# Patient Record
Sex: Female | Born: 1995
Health system: Southern US, Community
[De-identification: ages and names within clinical notes are randomized; demographics above are authoritative.]

## PROBLEM LIST (undated history)

## (undated) DIAGNOSIS — D649 Anemia, unspecified: Secondary | ICD-10-CM

## (undated) DIAGNOSIS — A630 Anogenital (venereal) warts: Secondary | ICD-10-CM

## (undated) DIAGNOSIS — Z973 Presence of spectacles and contact lenses: Secondary | ICD-10-CM

## (undated) DIAGNOSIS — N944 Primary dysmenorrhea: Secondary | ICD-10-CM

## (undated) HISTORY — DX: Anemia, unspecified: D64.9

## (undated) HISTORY — PX: WISDOM TOOTH EXTRACTION: SHX21

## (undated) HISTORY — DX: Primary dysmenorrhea: N94.4

## (undated) HISTORY — DX: Anogenital (venereal) warts: A63.0

## (undated) HISTORY — PX: OTHER SURGICAL HISTORY: SHX169

---
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
## 2015-06-13 ENCOUNTER — Other Ambulatory Visit: Payer: Self-pay | Admitting: Family Medicine

## 2015-06-14 NOTE — Telephone Encounter (Signed)
Patient requesting refill. 

## 2015-06-14 NOTE — Telephone Encounter (Signed)
Sending refill until Nov when she is due for a CPE, please schedule follow up. Thank you

## 2015-08-03 ENCOUNTER — Telehealth: Payer: Self-pay | Admitting: Family Medicine

## 2015-08-03 NOTE — Telephone Encounter (Signed)
Pt called stating she is spotting in between period on her birth control and it has been happening for a while?  Can u change to something else?

## 2015-08-03 NOTE — Telephone Encounter (Signed)
Patient is requesting a return call. Would like to discuss her birthcontrol [phone]

## 2015-08-04 NOTE — Telephone Encounter (Signed)
Spotting is very common when we prescribe 3 months straight of ocp's.  If she bleeds heavily she can stop taking medication for 5 days and resume it again. We can switch her to ocp's like Sprintec and have a cycle q 28 days

## 2015-08-04 NOTE — Telephone Encounter (Signed)
Pt notified common and will continue at this time

## 2015-11-10 ENCOUNTER — Ambulatory Visit (INDEPENDENT_AMBULATORY_CARE_PROVIDER_SITE_OTHER): Payer: BLUE CROSS/BLUE SHIELD | Admitting: Family Medicine

## 2015-11-10 ENCOUNTER — Encounter: Payer: Self-pay | Admitting: Family Medicine

## 2015-11-10 VITALS — BP 114/76 | HR 82 | Temp 98.0°F | Resp 16 | Ht 66.0 in | Wt 134.9 lb

## 2015-11-10 DIAGNOSIS — Z23 Encounter for immunization: Secondary | ICD-10-CM | POA: Diagnosis not present

## 2015-11-10 DIAGNOSIS — Z3041 Encounter for surveillance of contraceptive pills: Secondary | ICD-10-CM | POA: Diagnosis not present

## 2015-11-10 DIAGNOSIS — N923 Ovulation bleeding: Secondary | ICD-10-CM

## 2015-11-10 DIAGNOSIS — Z0000 Encounter for general adult medical examination without abnormal findings: Secondary | ICD-10-CM | POA: Diagnosis not present

## 2015-11-10 DIAGNOSIS — Z113 Encounter for screening for infections with a predominantly sexual mode of transmission: Secondary | ICD-10-CM | POA: Diagnosis not present

## 2015-11-10 DIAGNOSIS — Z01419 Encounter for gynecological examination (general) (routine) without abnormal findings: Secondary | ICD-10-CM

## 2015-11-10 DIAGNOSIS — Z13220 Encounter for screening for lipoid disorders: Secondary | ICD-10-CM

## 2015-11-10 DIAGNOSIS — Z79899 Other long term (current) drug therapy: Secondary | ICD-10-CM | POA: Diagnosis not present

## 2015-11-10 DIAGNOSIS — M419 Scoliosis, unspecified: Secondary | ICD-10-CM | POA: Insufficient documentation

## 2015-11-10 DIAGNOSIS — N939 Abnormal uterine and vaginal bleeding, unspecified: Secondary | ICD-10-CM | POA: Diagnosis not present

## 2015-11-10 LAB — POCT URINE PREGNANCY: PREG TEST UR: NEGATIVE

## 2015-11-10 MED ORDER — LEVONORGEST-ETH ESTRAD 91-DAY 0.15-0.03 MG PO TABS: 1.0000 | ORAL_TABLET | Freq: Every day | ORAL | Status: DC

## 2015-11-10 NOTE — Progress Notes (Signed)
Name: Alison Nunez   MRN: [id number]    DOB: 1996-12-15   Date:11/10/2015       Progress Note  Subjective  Chief Complaint  Chief Complaint  Patient presents with  . Annual Exam    HPI  Well woman: patient is doing well, now sexually active, dating for the past 8 months, in school full time, no complains except for intermenstrual spotting, and this month has been bleeding, uses condoms also for STI screen, has an older boyfriend that is getting ready to join the [employer]   Past Surgical History  Procedure Laterality Date  . History of removal of cyst      pionidal cyst x's 2.    History reviewed. No pertinent family history.  Social History   Social History  . Marital Status: Single    Spouse Name: N/A  . Number of Children: N/A  . Years of Education: N/A   Occupational History  . Not on file.   Social History Main Topics  . Smoking status: Never Smoker   . Smokeless tobacco: Never Used  . Alcohol Use: No  . Drug Use: No  . Sexual Activity:    Partners: Male    Birth Control/ Protection: Pill   Other Topics Concern  . Not on file   Social History Narrative     Current outpatient prescriptions:  .  levonorgestrel-ethinyl estradiol (SEASONALE,INTROVALE,JOLESSA) 0.15-0.03 MG tablet, Take 1 tablet by mouth daily., Disp: 1 Package, Rfl: 0  No Known Allergies   ROS  Constitutional: Negative for fever or weight change.  Respiratory: Negative for cough and shortness of breath.   Cardiovascular: Negative for chest pain or palpitations.  Gastrointestinal: Negative for abdominal pain, no bowel changes.  Musculoskeletal: Negative for gait problem or joint swelling.  Skin: Negative for rash.  Neurological: Negative for dizziness or headache.  No other specific complaints in a complete review of systems (except as listed in HPI above).  Objective  Filed Vitals:   11/10/15 1437  BP: 114/76  Pulse: 82  Temp: 98 F (36.7 C)  TempSrc: Oral  Resp: 16  Height:   (1.676 m)  Weight: 134 lb 14.4 oz (61.19 kg)  SpO2: 99%    Body mass index is 21.78 kg/(m^2).  Physical Exam  Constitutional: Patient appears well-developed and well-nourished. No distress.  HENT: Head: Normocephalic and atraumatic. Ears: B TMs ok, no erythema or effusion; Nose: Nose normal. Mouth/Throat: Oropharynx is clear and moist. No oropharyngeal exudate.  Eyes: Conjunctivae and EOM are normal. Pupils are equal, round, and reactive to light. No scleral icterus.  Neck: Normal range of motion. Neck supple. No JVD present. No thyromegaly present.  Cardiovascular: Normal rate, regular rhythm and normal heart sounds.  No murmur heard. No BLE edema. Pulmonary/Chest: Effort normal and breath sounds normal. No respiratory distress. Abdominal: Soft. Bowel sounds are normal, no distension. There is no tenderness. no masses Breast: no lumps or masses, no nipple discharge or rashes FEMALE GENITALIA:  Not done on her cycle RECTAL: not done Musculoskeletal: Normal range of motion, no joint effusion Mild scoliosis Neurological: he is alert and oriented to person, place, and time. No cranial nerve deficit. Coordination, balance, strength, speech and gait are normal.  Skin: Skin is warm and dry. No rash noted. No erythema.  Psychiatric: Patient has a normal mood and affect. behavior is normal. Judgment and thought content normal.    PHQ2/9: Depression screen PHQ 2/9 11/10/2015  Decreased Interest 0  Down, Depressed, Hopeless 0  PHQ - 2 Score 0     Fall Risk: Fall Risk  11/10/2015  Falls in the past year? No     Functional Status Survey: Is the patient deaf or have difficulty hearing?: No Does the patient have difficulty seeing, even when wearing glasses/contacts?: Yes (glasses and contacts) Does the patient have difficulty concentrating, remembering, or making decisions?: No Does the patient have difficulty walking or climbing stairs?: No Does the patient have difficulty  dressing or bathing?: No Does the patient have difficulty doing errands alone such as visiting a doctor's office or shopping?: No    Assessment & Plan  1. Well woman exam  Discussed importance of 150 minutes of physical activity weekly, eat two servings of fish weekly, eat one serving of tree nuts ( cashews, pistachios, pecans, almonds.Marland Kitchen.) every other day, eat 6 servings of fruit/vegetables daily and drink plenty of water and avoid sweet beverages.   2. Needs flu shot  - Flu Vaccine QUAD 36+ mos PF IM (Fluarix & Fluzone Quad PF)  3. Encounter for surveillance of contraceptive pills  - levonorgestrel-ethinyl estradiol (SEASONALE,INTROVALE,JOLESSA) 0.15-0.03 MG tablet; Take 1 tablet by mouth daily.  Dispense: 1 Package; Refill: 0  4. Spotting between menses  On [place] and spots on the first two months, but this time is bleeding like a cycle, we will change to [place], and return in one month for a pap smear and follow up - POCT urine pregnancy - TSH  5. Routine screening for STI (sexually transmitted infection)  - HIV antibody - RPR - GC/chlamydia probe amp, urine  6. Long-term use of high-risk medication  - Comprehensive metabolic panel  7. Lipid screening  - Lipid panel - Comprehensive metabolic panel

## 2015-11-13 ENCOUNTER — Other Ambulatory Visit: Payer: Self-pay | Admitting: Family Medicine

## 2015-11-16 LAB — GC/CHLAMYDIA PROBE AMP
CHLAMYDIA, DNA PROBE: NEGATIVE
NEISSERIA GONORRHOEAE BY PCR: NEGATIVE

## 2015-11-16 LAB — SPECIMEN STATUS REPORT

## 2016-02-01 ENCOUNTER — Other Ambulatory Visit: Payer: Self-pay | Admitting: Family Medicine

## 2016-02-02 NOTE — Telephone Encounter (Signed)
Patient requesting refill. 

## 2016-02-24 ENCOUNTER — Other Ambulatory Visit: Payer: BLUE CROSS/BLUE SHIELD

## 2016-03-25 ENCOUNTER — Encounter: Payer: Self-pay | Admitting: *Deleted

## 2016-03-25 ENCOUNTER — Ambulatory Visit
Admit: 2016-03-25 | Discharge: 2016-03-25 | Disposition: A | Discharge status: Home / Self Care | Payer: BLUE CROSS/BLUE SHIELD | Attending: Family Medicine | Admitting: Family Medicine

## 2016-03-25 ENCOUNTER — Ambulatory Visit
Admission: EM | Admit: 2016-03-25 | Discharge: 2016-03-25 | Disposition: A | Discharge status: Home / Self Care | Payer: BLUE CROSS/BLUE SHIELD | Attending: Family Medicine | Admitting: Family Medicine

## 2016-03-25 DIAGNOSIS — T148XXA Other injury of unspecified body region, initial encounter: Secondary | ICD-10-CM

## 2016-03-25 DIAGNOSIS — Z79899 Other long term (current) drug therapy: Secondary | ICD-10-CM | POA: Insufficient documentation

## 2016-03-25 DIAGNOSIS — B079 Viral wart, unspecified: Secondary | ICD-10-CM | POA: Diagnosis not present

## 2016-03-25 DIAGNOSIS — S0083XA Contusion of other part of head, initial encounter: Secondary | ICD-10-CM | POA: Diagnosis not present

## 2016-03-25 DIAGNOSIS — T148 Other injury of unspecified body region: Secondary | ICD-10-CM

## 2016-03-25 DIAGNOSIS — R51 Headache: Secondary | ICD-10-CM | POA: Diagnosis not present

## 2016-03-25 DIAGNOSIS — S0993XA Unspecified injury of face, initial encounter: Secondary | ICD-10-CM | POA: Diagnosis present

## 2016-03-25 DIAGNOSIS — N944 Primary dysmenorrhea: Secondary | ICD-10-CM | POA: Diagnosis not present

## 2016-03-25 DIAGNOSIS — Z9889 Other specified postprocedural states: Secondary | ICD-10-CM | POA: Diagnosis not present

## 2016-03-25 DIAGNOSIS — S022XXA Fracture of nasal bones, initial encounter for closed fracture: Secondary | ICD-10-CM | POA: Diagnosis not present

## 2016-03-25 LAB — PREGNANCY, URINE: Preg Test, Ur: NEGATIVE
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
## 2016-03-26 ENCOUNTER — Ambulatory Visit: Payer: BLUE CROSS/BLUE SHIELD

## 2016-03-26 ENCOUNTER — Telehealth: Payer: Self-pay

## 2016-03-26 NOTE — Telephone Encounter (Signed)
Per the request of Dr. Alba CoryKrichna Nunez, I contacted this patient to see how she was doing and she simply stated, "i'm fine."

## 2016-03-29 DIAGNOSIS — S022XXA Fracture of nasal bones, initial encounter for closed fracture: Secondary | ICD-10-CM | POA: Diagnosis not present

## 2016-04-02 ENCOUNTER — Encounter: Payer: Self-pay | Admitting: *Deleted

## 2016-04-08 NOTE — Discharge Instructions (Signed)

## 2016-04-09 ENCOUNTER — Ambulatory Visit: Payer: BLUE CROSS/BLUE SHIELD | Admitting: Student in an Organized Health Care Education/Training Program

## 2016-04-09 ENCOUNTER — Ambulatory Visit
Admission: RE | Admit: 2016-04-09 | Discharge: 2016-04-09 | Disposition: A | Discharge status: Home / Self Care | Payer: BLUE CROSS/BLUE SHIELD | Source: Ambulatory Visit | Attending: Otolaryngology | Admitting: Otolaryngology

## 2016-04-09 ENCOUNTER — Encounter
Admission: RE | Disposition: A | Discharge status: Home / Self Care | Payer: Self-pay | Source: Ambulatory Visit | Attending: Otolaryngology

## 2016-04-09 DIAGNOSIS — Z823 Family history of stroke: Secondary | ICD-10-CM | POA: Diagnosis not present

## 2016-04-09 DIAGNOSIS — Y929 Unspecified place or not applicable: Secondary | ICD-10-CM | POA: Diagnosis not present

## 2016-04-09 DIAGNOSIS — Z833 Family history of diabetes mellitus: Secondary | ICD-10-CM | POA: Insufficient documentation

## 2016-04-09 DIAGNOSIS — Y939 Activity, unspecified: Secondary | ICD-10-CM | POA: Insufficient documentation

## 2016-04-09 DIAGNOSIS — S022XXA Fracture of nasal bones, initial encounter for closed fracture: Secondary | ICD-10-CM | POA: Diagnosis not present

## 2016-04-09 HISTORY — DX: Presence of spectacles and contact lenses: Z97.3

## 2016-04-09 HISTORY — PX: CLOSED REDUCTION NASAL FRACTURE: SHX5365

## 2016-04-09 SURGERY — CLOSED REDUCTION, FRACTURE, NASAL BONE
Anesthesia: General | Site: Nose | Wound class: Clean Contaminated

## 2016-04-09 MED ORDER — PROPOFOL 10 MG/ML IV BOLUS
INTRAVENOUS | Status: DC | PRN
  Administered 2016-04-09: 150 mg via INTRAVENOUS

## 2016-04-09 MED ORDER — MIDAZOLAM HCL 5 MG/5ML IJ SOLN
INTRAMUSCULAR | Status: DC | PRN
  Administered 2016-04-09: 2 mg via INTRAVENOUS

## 2016-04-09 MED ORDER — FENTANYL CITRATE (PF) 100 MCG/2ML IJ SOLN
25.0000 ug | INTRAMUSCULAR | Status: DC | PRN
  Administered 2016-04-09: 25 ug via INTRAVENOUS

## 2016-04-09 MED ORDER — LIDOCAINE HCL (CARDIAC) 20 MG/ML IV SOLN
INTRAVENOUS | Status: DC | PRN
  Administered 2016-04-09: 50 mg via INTRATRACHEAL

## 2016-04-09 MED ORDER — LACTATED RINGERS IV SOLN
INTRAVENOUS | Status: DC
  Administered 2016-04-09: 09:00:00 via INTRAVENOUS

## 2016-04-09 MED ORDER — FENTANYL CITRATE (PF) 100 MCG/2ML IJ SOLN
INTRAMUSCULAR | Status: DC | PRN
  Administered 2016-04-09 (×2): 50 ug via INTRAVENOUS

## 2016-04-09 MED ORDER — ONDANSETRON HCL 4 MG/2ML IJ SOLN
INTRAMUSCULAR | Status: DC | PRN
  Administered 2016-04-09: 4 mg via INTRAVENOUS

## 2016-04-09 MED ORDER — DEXAMETHASONE SODIUM PHOSPHATE 4 MG/ML IJ SOLN
INTRAMUSCULAR | Status: DC | PRN
Start: 2016-04-09 — End: 2016-04-09
  Administered 2016-04-09: 4 mg via INTRAVENOUS

## 2016-04-09 MED ORDER — OXYCODONE HCL 5 MG/5ML PO SOLN: 5.0000 mg | Freq: Once | ORAL | Status: AC | PRN

## 2016-04-09 MED ORDER — HYDROCODONE-ACETAMINOPHEN 5-325 MG PO TABS: 1.0000 | ORAL_TABLET | Freq: Four times a day (QID) | ORAL | Status: DC | PRN

## 2016-04-09 MED ORDER — OXYMETAZOLINE HCL 0.05 % NA SOLN
NASAL | Status: DC | PRN
  Administered 2016-04-09: 1 via TOPICAL

## 2016-04-09 MED ORDER — OXYCODONE HCL 5 MG PO TABS
5.0000 mg | ORAL_TABLET | Freq: Once | ORAL | Status: AC | PRN
  Administered 2016-04-09: 5 mg via ORAL

## 2016-04-09 SURGICAL SUPPLY — 18 items
ADHESIVE MASTISOL STRL (MISCELLANEOUS) ×2 IMPLANT
BASIN GRAD PLASTIC 32OZ STRL (MISCELLANEOUS) ×2 IMPLANT
CANISTER SUCT 1200ML W/VALVE (MISCELLANEOUS) ×2 IMPLANT
COVER MAYO STAND STRL (DRAPES) ×2 IMPLANT
COVER TABLE BACK 60X90 (DRAPES) IMPLANT
CUP MEDICINE 2OZ PLAST GRAD ST (MISCELLANEOUS) ×2 IMPLANT
DRSG NASAL 4CM NASOPORE (MISCELLANEOUS) ×2 IMPLANT
GAUZE SPONGE 4X4 12PLY STRL (GAUZE/BANDAGES/DRESSINGS) ×2 IMPLANT
GLOVE BIO SURGEON STRL SZ7.5 (GLOVE) ×4 IMPLANT
KIT ROOM TURNOVER OR (KITS) IMPLANT
MARKER SKIN DUAL TIP RULER LAB (MISCELLANEOUS) ×2 IMPLANT
NEEDLE HYPO 25GX1X1/2 BEV (NEEDLE) IMPLANT
PATTIES SURGICAL .5 X3 (DISPOSABLE) ×2 IMPLANT
STRIP CLOSURE SKIN 1/2X4 (GAUZE/BANDAGES/DRESSINGS) ×2 IMPLANT
SYRINGE 10CC LL (SYRINGE) IMPLANT
TOWEL OR 17X26 4PK STRL BLUE (TOWEL DISPOSABLE) ×2 IMPLANT
TUBING CONN 6MMX3.1M (TUBING) ×1
TUBING SUCTION CONN 0.25 STRL (TUBING) ×1 IMPLANT

## 2016-04-09 NOTE — Anesthesia Preprocedure Evaluation (Signed)
Anesthesia Evaluation  Patient identified by MRN, date of birth, ID band  Reviewed: NPO status   History of Anesthesia Complications Negative for: history of anesthetic complications  Airway Mallampati: II  TM Distance: >3 FB Neck ROM: full    Dental no notable dental hx.    Pulmonary neg pulmonary ROS,    Pulmonary exam normal        Cardiovascular negative cardio ROS Normal cardiovascular exam     Neuro/Psych negative neurological ROS  negative psych ROS   GI/Hepatic negative GI ROS, Neg liver ROS,   Endo/Other  negative endocrine ROS  Renal/GU negative Renal ROS  negative genitourinary   Musculoskeletal   Abdominal   Peds  Hematology negative hematology ROS (+)   Anesthesia Other Findings   Reproductive/Obstetrics negative OB ROS                             Anesthesia Physical Anesthesia Plan  ASA: I  Anesthesia Plan: General   Post-op Pain Management:    Induction:   Airway Management Planned:   Additional Equipment:   Intra-op Plan:   Post-operative Plan:   Informed Consent: I have reviewed the patients History and Physical, chart, labs and discussed the procedure including the risks, benefits and alternatives for the proposed anesthesia with the patient or authorized representative who has indicated his/her understanding and acceptance.     Plan Discussed with: CRNA  Anesthesia Plan Comments:         Anesthesia Quick Evaluation  

## 2016-04-09 NOTE — Transfer of Care (Signed)
Immediate Anesthesia Transfer of Care Note  Patient: Alison Nunez  Procedure(s) Performed: Procedure(s): CLOSED REDUCTION NASAL FRACTURE (N/A)  Patient Location: PACU  Anesthesia Type: General  Level of Consciousness: awake, alert  and patient cooperative  Airway and Oxygen Therapy: Patient Spontanous Breathing and Patient connected to supplemental oxygen  Post-op Assessment: Post-op Vital signs reviewed, Patient's Cardiovascular Status Stable, Respiratory Function Stable, Patent Airway and No signs of Nausea or vomiting  Post-op Vital Signs: Reviewed and stable  Complications: No apparent anesthesia complications

## 2016-04-09 NOTE — Anesthesia Postprocedure Evaluation (Addendum)
Anesthesia Post Note  Patient: Alison Nunez  Procedure(s) Performed: Procedure(s) (LRB): CLOSED REDUCTION NASAL FRACTURE (N/A)  Patient location during evaluation: PACU Anesthesia Type: General Level of consciousness: awake and alert Pain management: pain level controlled Vital Signs Assessment: post-procedure vital signs reviewed and stable Respiratory status: spontaneous breathing, nonlabored ventilation, respiratory function stable and patient connected to nasal cannula oxygen Cardiovascular status: blood pressure returned to baseline and stable Postop Assessment: no signs of nausea or vomiting Anesthetic complications: no    Aryahi Denzler

## 2016-04-09 NOTE — Anesthesia Procedure Notes (Signed)
Procedure Name: LMA Insertion Date/Time: 04/09/2016 10:13 AM Performed by: Andee PolesBUSH, Alison Dewalt Pre-anesthesia Checklist: Patient identified, Emergency Drugs available, Suction available, Timeout performed and Patient being monitored Patient Re-evaluated:Patient Re-evaluated prior to inductionOxygen Delivery Method: Circle system utilized Preoxygenation: Pre-oxygenation with 100% oxygen Intubation Type: IV induction LMA: LMA inserted LMA Size: 4.0 Number of attempts: 1 Placement Confirmation: positive ETCO2 and breath sounds checked- equal and bilateral Tube secured with: Tape

## 2016-04-09 NOTE — H&P (Signed)
History and physical reviewed and will be scanned in later. No change in medical status reported by the patient or family, appears stable for surgery. All questions regarding the procedure answered, and patient (or family if a child) expressed understanding of the procedure.  Alison Nunez S @TODAY@ 

## 2016-04-09 NOTE — Op Note (Signed)
04/09/2016 10:25 AM  Alison NortonAshley B Nunez [id number]   Pre-Op Dx: NASAL FRACTURE  Post-op Dx: SAME  Procedure: Closed reduction of nasal fracture   Surgeon: Sandi MealyBennett, Dallana Mavity S., MD  Anesthesia: General Endotracheal   EBL: Minimal   Complications: None   Findings: Nasal dorsum with left depressed nasal bone fracture   Procedure: After the patient was identified in holding and the history and physical and consent was reviewed, the patient was taken to the operating room and placed in a supine position. General  was induced in the normal fashion with a laryngeal mask airway. The patient was draped with the eyes protected. The nose was decongested with Afrin moistened pledgets. After allowing time for decongestion, these were removed.   A Boies elevator was then placed intranasally on the left, and used to manipulate the nasal bone fragment until the nasal dorsum appeared to be normal with no palpable step-off deformity. The left nasal bone fragment would not stay in position easily without some internal support, so a Nasopore absorbable pack was placed into the left nasal cavity under the bony fragment to hold it in position. This provided except cosmetic result with no palpable step-off deformity. Following this, the skin was prepped with Mastisol, and 1/2 Ster-strips applied to the nose. Next an Aquaplast splint was fashioned to fit the nasal dorsum, and placed for protection of the nasal bones during healing. This was further secured with Steri-strips. The care of patient was returned to anesthesia, awakened, and transferred to recovery in stable condition.   Disposition: PACU to home   Plan: Regular diet. Ice pack to nose as needed for pain and swelling. Keep nasal splint in place until follow-up. Limit exercise and strenuous activity for the next week. Recheck my office once week. Do not blow nose.  Sandi MealyBennett, Yurani Fettes S., MD  10:25 AM 04/09/2016

## 2016-04-10 ENCOUNTER — Encounter: Payer: Self-pay | Admitting: Otolaryngology

## 2016-05-08 ENCOUNTER — Ambulatory Visit (INDEPENDENT_AMBULATORY_CARE_PROVIDER_SITE_OTHER): Payer: BLUE CROSS/BLUE SHIELD

## 2016-05-08 DIAGNOSIS — Z111 Encounter for screening for respiratory tuberculosis: Secondary | ICD-10-CM

## 2016-05-10 LAB — TB SKIN TEST
INDURATION: 0 mm
TB SKIN TEST: NEGATIVE

## 2016-10-28 ENCOUNTER — Other Ambulatory Visit: Payer: Self-pay | Admitting: Family Medicine

## 2016-10-31 ENCOUNTER — Telehealth: Payer: Self-pay | Admitting: Family Medicine

## 2016-10-31 NOTE — Telephone Encounter (Signed)
Her annual cpe is scheduled for 12/13/16 however she will be completely out of her birthcontrol within 2 weeks. She is requesting a ref[facility] to be [facility]

## 2016-11-04 ENCOUNTER — Other Ambulatory Visit: Payer: Self-pay | Admitting: Family Medicine

## 2016-11-04 MED ORDER — LEVONORGEST-ETH ESTRAD 91-DAY 0.15-0.03 MG PO TABS
1.0000 | ORAL_TABLET | Freq: Every day | ORAL | 0 refills | Status: DC
Start: 2016-11-04 — End: 2016-12-13

## 2016-11-04 NOTE — Telephone Encounter (Signed)
Pt informed

## 2016-11-04 NOTE — Telephone Encounter (Signed)
done

## 2016-12-13 ENCOUNTER — Other Ambulatory Visit: Payer: Self-pay | Admitting: Family Medicine

## 2016-12-13 ENCOUNTER — Encounter: Payer: Self-pay | Admitting: Family Medicine

## 2016-12-13 ENCOUNTER — Ambulatory Visit (INDEPENDENT_AMBULATORY_CARE_PROVIDER_SITE_OTHER): Payer: BLUE CROSS/BLUE SHIELD | Admitting: Family Medicine

## 2016-12-13 VITALS — BP 110/60 | HR 76 | Temp 98.6°F | Resp 16 | Ht 65.0 in | Wt 142.6 lb

## 2016-12-13 DIAGNOSIS — Z01419 Encounter for gynecological examination (general) (routine) without abnormal findings: Secondary | ICD-10-CM

## 2016-12-13 DIAGNOSIS — Z13220 Encounter for screening for lipoid disorders: Secondary | ICD-10-CM

## 2016-12-13 DIAGNOSIS — Z124 Encounter for screening for malignant neoplasm of cervix: Secondary | ICD-10-CM | POA: Diagnosis not present

## 2016-12-13 DIAGNOSIS — N944 Primary dysmenorrhea: Secondary | ICD-10-CM | POA: Insufficient documentation

## 2016-12-13 DIAGNOSIS — Z862 Personal history of diseases of the blood and blood-forming organs and certain disorders involving the immune mechanism: Secondary | ICD-10-CM | POA: Insufficient documentation

## 2016-12-13 DIAGNOSIS — Z3041 Encounter for surveillance of contraceptive pills: Secondary | ICD-10-CM

## 2016-12-13 DIAGNOSIS — Z23 Encounter for immunization: Secondary | ICD-10-CM

## 2016-12-13 DIAGNOSIS — Z79899 Other long term (current) drug therapy: Secondary | ICD-10-CM | POA: Diagnosis not present

## 2016-12-13 DIAGNOSIS — N841 Polyp of cervix uteri: Secondary | ICD-10-CM | POA: Diagnosis not present

## 2016-12-13 DIAGNOSIS — Z114 Encounter for screening for human immunodeficiency virus [HIV]: Secondary | ICD-10-CM

## 2016-12-13 DIAGNOSIS — R87610 Atypical squamous cells of undetermined significance on cytologic smear of cervix (ASC-US): Secondary | ICD-10-CM | POA: Diagnosis not present

## 2016-12-13 DIAGNOSIS — N921 Excessive and frequent menstruation with irregular cycle: Secondary | ICD-10-CM | POA: Insufficient documentation

## 2016-12-13 LAB — CBC WITH DIFFERENTIAL/PLATELET
BASOS ABS: 0 {cells}/uL (ref 0–200)
Basophils Relative: 0 %
EOS PCT: 2 %
Eosinophils Absolute: 92 cells/uL (ref 15–500)
HCT: 44.7 % (ref 35.0–45.0)
Hemoglobin: 14.7 g/dL (ref 11.7–15.5)
Lymphocytes Relative: 35 %
Lymphs Abs: 1610 cells/uL (ref 850–3900)
MCH: 30.7 pg (ref 27.0–33.0)
MCHC: 32.9 g/dL (ref 32.0–36.0)
MCV: 93.3 fL (ref 80.0–100.0)
MONOS PCT: 5 %
MPV: 10.1 fL (ref 7.5–12.5)
Monocytes Absolute: 230 cells/uL (ref 200–950)
NEUTROS ABS: 2668 {cells}/uL (ref 1500–7800)
NEUTROS PCT: 58 %
PLATELETS: 288 10*3/uL (ref 140–400)
RBC: 4.79 MIL/uL (ref 3.80–5.10)
RDW: 12.9 % (ref 11.0–15.0)
WBC: 4.6 10*3/uL (ref 3.8–10.8)

## 2016-12-13 LAB — COMPLETE METABOLIC PANEL WITH GFR
ALT: 14 U/L (ref 6–29)
AST: 18 U/L (ref 10–30)
Albumin: 4.6 g/dL (ref 3.6–5.1)
Alkaline Phosphatase: 35 U/L (ref 33–115)
BUN: 16 mg/dL (ref 7–25)
CHLORIDE: 102 mmol/L (ref 98–110)
CO2: 24 mmol/L (ref 20–31)
Calcium: 9.5 mg/dL (ref 8.6–10.2)
Creat: 0.99 mg/dL (ref 0.50–1.10)
GFR, EST NON AFRICAN AMERICAN: 82 mL/min (ref >=60)
GFR, Est African American: >89 mL/min (ref >=60)
GLUCOSE: 76 mg/dL (ref 65–99)
POTASSIUM: 4.2 mmol/L (ref 3.5–5.3)
SODIUM: 135 mmol/L (ref 135–146)
Total Bilirubin: 1.2 mg/dL (ref 0.2–1.2)
Total Protein: 8 g/dL (ref 6.1–8.1)

## 2016-12-13 LAB — LIPID PANEL
Cholesterol: 199 mg/dL (ref <200)
HDL: 69 mg/dL (ref >50)
LDL CALC: 115 mg/dL — AB (ref <100)
TRIGLYCERIDES: 77 mg/dL (ref <150)
Total CHOL/HDL Ratio: 2.9 Ratio (ref <5.0)
VLDL: 15 mg/dL (ref <30)

## 2016-12-13 MED ORDER — LEVONORGEST-ETH ESTRAD 91-DAY 0.15-0.03 MG PO TABS: 1.0000 | ORAL_TABLET | Freq: Every day | ORAL | 3 refills | Status: DC

## 2016-12-13 NOTE — Progress Notes (Signed)
Name: Alison Nunez   MRN: [id number]    DOB: 01/13/1996   Date:12/13/2016       Progress Note  Subjective  Chief Complaint  Chief Complaint  Patient presents with  . Annual Exam    HPI  Well Woman: denies bladder problems, breast lumps, change in bowel movements, no vaginal discharge. Taking ocp as prescribed, cramping has improved, no longer has intermenstrual bleeding and cycles are not as heavy. She has a history of iron deficiency anemia, but is not currently taking supplementation.    Patient Active Problem List   Diagnosis Date Noted  . Excessive and frequent menstruation with irregular cycle 12/13/2016  . H/O iron deficiency anemia 12/13/2016  . Primary dysmenorrhea 12/13/2016  . Encounter for surveillance of contraceptive pills 11/10/2015  . Scoliosis 11/10/2015    Past Surgical History:  Procedure Laterality Date  . CLOSED REDUCTION NASAL FRACTURE N/A 04/09/2016   Procedure: CLOSED REDUCTION NASAL FRACTURE;  Surgeon: Geanie LoganPaul Bennett, MD;  [facility];  Service: ENT;  Laterality: N/A;  . history of removal of cyst     pilonidal cyst x's 2.  . WISDOM TOOTH EXTRACTION      Family History  Problem Relation Age of Onset  . Diabetes Maternal Grandmother     Social History   Social History  . Marital status: Single    Spouse name: N/A  . Number of children: N/A  . Years of education: N/A   Occupational History  . Not on file.   Social History Main Topics  . Smoking status: Never Smoker  . Smokeless tobacco: Never Used  . Alcohol use No  . Drug use: No  . Sexual activity: Not Currently    Partners: Male    Birth control/ protection: Pill, Condom   Other Topics Concern  . Not on file   Social History Narrative  . No narrative on file     Current Outpatient Prescriptions:  .  levonorgestrel-ethinyl estradiol (SEASONALE,INTROVALE,JOLESSA) 0.15-0.03 MG tablet, Take 1 tablet by mouth daily., Disp: 91 tablet, Rfl: 3  No Known  Allergies   ROS  Constitutional: Negative for fever or weight change.  Respiratory: Negative for cough and shortness of breath.   Cardiovascular: Negative for chest pain or palpitations.  Gastrointestinal: Negative for abdominal pain, no bowel changes.  Musculoskeletal: Negative for gait problem or joint swelling.  Skin: Negative for rash.  Neurological: Negative for dizziness or headache.  No other specific complaints in a complete review of systems (except as listed in HPI above).  Objective  Vitals:   12/13/16 1103  BP: 110/60  Pulse: 76  Resp: 16  Temp: 98.6 F (37 C)  TempSrc: Oral  SpO2: 99%  Weight: 142 lb 9.6 oz (64.7 kg)  Height: 5\' 5"  (1.651 m)    Body mass index is 23.73 kg/m.  Physical Exam  Constitutional: Patient appears well-developed and well-nourished. No distress.  HENT: Head: Normocephalic and atraumatic. Ears: B TMs ok, no erythema or effusion; Nose: Nose normal. Mouth/Throat: Oropharynx is clear and moist. No oropharyngeal exudate.  Eyes: Conjunctivae and EOM are normal. Pupils are equal, round, and reactive to light. No scleral icterus.  Neck: Normal range of motion. Neck supple. No JVD present. No thyromegaly present.  Cardiovascular: Normal rate, regular rhythm and normal heart sounds.  No murmur heard. No BLE edema. Pulmonary/Chest: Effort normal and breath sounds normal. No respiratory distress. Abdominal: Soft. Bowel sounds are normal, no distension. There is no tenderness. no masses Breast: no  lumps or masses, no nipple discharge or rashes FEMALE GENITALIA:  External genitalia normal External urethra normal Vaginal vault normal without discharge or lesions Cervix friable, with polyp, patient gave verbal consent. Ring forceps used, patient tolerated procedure well, she had minor cramping and bleeding.  Bimanual exam normal without masses RECTAL: not done Musculoskeletal: Normal range of motion, no joint effusions. No gross  deformities Neurological: he is alert and oriented to person, place, and time. No cranial nerve deficit. Coordination, balance, strength, speech and gait are normal.  Skin: Skin is warm and dry. No rash noted. No erythema.  Psychiatric: Patient has a normal mood and affect. behavior is normal. Judgment and thought content normal.  PHQ2/9: Depression screen PHQ 2/9 11/10/2015  Decreased Interest 0  Down, Depressed, Hopeless 0  PHQ - 2 Score 0     Fall Risk: Fall Risk  11/10/2015  Falls in the past year? No     Assessment & Plan  1. Well woman exam  Discussed importance of 150 minutes of physical activity weekly, eat two servings of fish weekly, eat one serving of tree nuts ( cashews, pistachios, pecans, almonds.Marland Kitchen.) every other day, eat 6 servings of fruit/vegetables daily and drink plenty of water and avoid sweet beverages. Discussed importance of condom use and continue ocp - CBC with Differential/Platelet - COMPLETE METABOLIC PANEL WITH GFR  2. Need for influenza vaccination  - Flu Vaccine QUAD 36+ mos PF IM (Fluarix & Fluzone Quad PF)  3. Lipid screening  - Lipid panel  4. Long-term use of high-risk medication  - CBC with Differential/Platelet - COMPLETE METABOLIC PANEL WITH GFR  5. Encounter for screening for HIV  - HIV antibody  6. Cervical cancer screening  - Pap IG and Chlamydia/Gonococcus, NAA  7. Encounter for surveillance of contraceptive pills  - levonorgestrel-ethinyl estradiol (SEASONALE,INTROVALE,JOLESSA) 0.15-0.03 MG tablet; Take 1 tablet by mouth daily.  Dispense: 91 tablet; Refill: 3  8. Cervical polyp  - Surgical pathology  See description on physical exam

## 2016-12-13 NOTE — Addendum Note (Signed)
Addended by: Lelon FrohlichGOLDEN, Loann Chahal D on: 12/13/2016 12:25 PM   Modules accepted: Orders

## 2016-12-14 LAB — HIV ANTIBODY (ROUTINE TESTING W REFLEX): HIV 1&2 Ab, 4th Generation: NONREACTIVE

## 2016-12-18 LAB — PAP IG AND CT-NG NAA
Chlamydia Probe Amp: NOT DETECTED
GC Probe Amp: NOT DETECTED

## 2016-12-18 LAB — PATHOLOGY

## 2016-12-25 ENCOUNTER — Other Ambulatory Visit: Payer: Self-pay

## 2016-12-25 DIAGNOSIS — N879 Dysplasia of cervix uteri, unspecified: Secondary | ICD-10-CM

## 2016-12-25 NOTE — Progress Notes (Signed)
rral

## 2017-01-27 ENCOUNTER — Other Ambulatory Visit: Payer: Self-pay | Admitting: Family Medicine

## 2017-01-27 NOTE — Telephone Encounter (Signed)
Patient requesting refill of Levonorgestrel-Ethinyl Estradiol to [facility].

## 2017-02-18 ENCOUNTER — Encounter: Payer: BLUE CROSS/BLUE SHIELD | Admitting: Obstetrics and Gynecology

## 2017-02-21 ENCOUNTER — Encounter: Payer: BLUE CROSS/BLUE SHIELD | Admitting: Obstetrics and Gynecology

## 2017-02-26 ENCOUNTER — Ambulatory Visit (INDEPENDENT_AMBULATORY_CARE_PROVIDER_SITE_OTHER): Payer: BLUE CROSS/BLUE SHIELD | Admitting: Obstetrics and Gynecology

## 2017-02-26 ENCOUNTER — Encounter: Payer: Self-pay | Admitting: Obstetrics and Gynecology

## 2017-02-26 VITALS — BP 132/84 | HR 92 | Ht 65.0 in | Wt 141.1 lb

## 2017-02-26 DIAGNOSIS — N879 Dysplasia of cervix uteri, unspecified: Secondary | ICD-10-CM

## 2017-02-26 NOTE — Progress Notes (Signed)
HPI:      [name] is a 21 y.o. G0P0000 who LMP was Patient's last menstrual period was 02/21/2017 (approximate).  Subjective:   She presents today  After having a cervical polyp removed which showed dysplasia. Complicating this is the fact that the pathologists has mis labeled the polyp as vaginal and has made the diagnosis VAIN. The patient's Pap smear also shows ASCUS but I can find no evidence of viral typing performed. This was her first Pap smear. She does describe a remote history of a vaginal wart which she says was "frozen off".    Hx: The following portions of the patient's history were reviewed and updated as appropriate:              She  has a past medical history of Genital warts; Primary dysmenorrhea; and Wears contact lenses. She  does not have any pertinent problems on file. She  has a past surgical history that includes history of removal of cyst; Wisdom tooth extraction; and Closed reduction nasal fracture (N/A, 04/09/2016). She  reports that she has never smoked. She has never used smokeless tobacco. She reports that she does not drink alcohol or use drugs. Current Outpatient Prescriptions on File Prior to Visit  Medication Sig Dispense Refill  . levonorgestrel-ethinyl estradiol (SEASONALE,INTROVALE,JOLESSA) 0.15-0.03 MG tablet TAKE 1 TABLET DAILY 91 tablet 0   No current facility-administered medications on file prior to visit.          Review of Systems:  Review of Systems  Constitutional: Denied constitutional symptoms, night sweats, recent illness, fatigue, fever, insomnia and weight loss.  Eyes: Denied eye symptoms, eye pain, photophobia, vision change and visual disturbance.  Ears/Nose/Throat/Neck: Denied ear, nose, throat or neck symptoms, hearing loss, nasal discharge, sinus congestion and sore throat.  Cardiovascular: Denied cardiovascular symptoms, arrhythmia, chest pain/pressure, edema, exercise intolerance, orthopnea and palpitations.  Respiratory:  Denied pulmonary symptoms, asthma, pleuritic pain, productive sputum, cough, dyspnea and wheezing.  Gastrointestinal: Denied, gastro-esophageal reflux, melena, nausea and vomiting.  Genitourinary: Denied genitourinary symptoms including symptomatic vaginal discharge, pelvic relaxation issues, and urinary complaints.  Musculoskeletal: Denied musculoskeletal symptoms, stiffness, swelling, muscle weakness and myalgia.  Dermatologic: Denied dermatology symptoms, rash and scar.  Neurologic: Denied neurology symptoms, dizziness, headache, neck pain and syncope.  Psychiatric: Denied psychiatric symptoms, anxiety and depression.  Endocrine: Denied endocrine symptoms including hot flashes and night sweats.   Meds:   Current Outpatient Prescriptions on File Prior to Visit  Medication Sig Dispense Refill  . levonorgestrel-ethinyl estradiol (SEASONALE,INTROVALE,JOLESSA) 0.15-0.03 MG tablet TAKE 1 TABLET DAILY 91 tablet 0   No current facility-administered medications on file prior to visit.     Objective:     Vitals:   02/26/17 1515  BP: 132/84  Pulse: 92              Physical examination   Pelvic:   Vulva: Normal appearance.  No lesions.  Vagina: No lesions or abnormalities noted.  Support: Normal pelvic support.  Urethra No masses tenderness or scarring.  Meatus Normal size without lesions or prolapse.  Cervix: Normal appearance.  No lesions.  Anus: Normal exam.  No lesions.  Perineum: Normal exam.  No lesions.        Bimanual   Uterus: Normal size.  Non-tender.  Mobile.  AV.  Adnexae: No masses.  Non-tender to palpation.  Cul-de-sac: Negative for abnormality.     Assessment:    G0P0000 Patient Active Problem List   Diagnosis Date Noted  .  Excessive and frequent menstruation with irregular cycle 12/13/2016  . H/O iron deficiency anemia 12/13/2016  . Primary dysmenorrhea 12/13/2016  . Cervical polyp 12/13/2016  . Encounter for surveillance of contraceptive pills 11/10/2015   . Scoliosis 11/10/2015     1. Cervical dysplasia     Likely dysplasia and a cervical polyp. I believe the pathologist as this labeled the polyp as vaginal. This may also explain her ASCUS Pap smear.   Plan:            1.  The patient and I have discussed this in detail. I believe the best strategy is to perform both an endocervical and exocervical pap with HPV typing. Based on these findings we will move forward with surveillance versus colposcopy. I have basing this on the assumption that the polyp removed was not vaginal and that it is mis labeled.       F/U  Return for We will contact her with any abnormal test results.  Elonda Husky, M.D. 02/26/2017 4:42 PM

## 2017-02-27 ENCOUNTER — Other Ambulatory Visit: Payer: Self-pay | Admitting: Obstetrics and Gynecology

## 2017-02-27 DIAGNOSIS — N879 Dysplasia of cervix uteri, unspecified: Secondary | ICD-10-CM | POA: Diagnosis not present

## 2017-02-27 NOTE — Addendum Note (Signed)
Addended by: Brooke DareSICK, Alicia Ackert L on: 02/27/2017 11:39 AM   Modules accepted: Orders

## 2017-03-04 LAB — PAP IG W/ RFLX HPV ASCU: PAP SMEAR COMMENT: 0

## 2017-03-04 LAB — PAP IG AND HPV HIGH-RISK
HPV, HIGH-RISK: NEGATIVE
PAP SMEAR COMMENT: 0

## 2017-03-05 ENCOUNTER — Telehealth: Payer: Self-pay

## 2017-03-05 NOTE — Telephone Encounter (Signed)
-----   Message from Linzie Collinavid James Evans, MD sent at 03/04/2017 12:51 PM EDT ----- Negative Pap and HPV

## 2017-03-05 NOTE — Telephone Encounter (Signed)
Informed patient of neg results per provider. 

## 2017-04-21 ENCOUNTER — Other Ambulatory Visit: Payer: Self-pay | Admitting: Family Medicine

## 2017-04-21 NOTE — Telephone Encounter (Signed)
Patient requesting [facility]
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
## 2017-05-20 ENCOUNTER — Ambulatory Visit (INDEPENDENT_AMBULATORY_CARE_PROVIDER_SITE_OTHER): Payer: BLUE CROSS/BLUE SHIELD

## 2017-05-20 DIAGNOSIS — Z111 Encounter for screening for respiratory tuberculosis: Secondary | ICD-10-CM

## 2017-05-20 NOTE — Progress Notes (Signed)
To come back Thursday 05/22/2017 to be read. Left outer forearm. Lot #[id number] Exp: 08/01/2019

## 2017-05-22 LAB — TB SKIN TEST
Induration: 0 mm
TB Skin Test: NEGATIVE

## 2017-07-21 ENCOUNTER — Other Ambulatory Visit: Payer: Self-pay | Admitting: Family Medicine

## 2017-08-27 ENCOUNTER — Telehealth: Payer: Self-pay

## 2017-08-27 NOTE — Telephone Encounter (Signed)
Pt asked for her Immunization record to be printed out. Rhonda asked me to print it off since Dr. Carlynn PurlSowles is nurse was busy. Printed it out and gave it to [place]

## 2017-10-20 ENCOUNTER — Other Ambulatory Visit: Payer: Self-pay | Admitting: Family Medicine

## 2017-11-06 ENCOUNTER — Ambulatory Visit (INDEPENDENT_AMBULATORY_CARE_PROVIDER_SITE_OTHER): Payer: BLUE CROSS/BLUE SHIELD

## 2017-11-06 DIAGNOSIS — Z23 Encounter for immunization: Secondary | ICD-10-CM | POA: Diagnosis not present

## 2017-12-19 ENCOUNTER — Encounter: Payer: BLUE CROSS/BLUE SHIELD | Admitting: Family Medicine

## 2018-01-06 ENCOUNTER — Encounter: Payer: Self-pay | Admitting: Family Medicine

## 2018-01-06 ENCOUNTER — Ambulatory Visit (INDEPENDENT_AMBULATORY_CARE_PROVIDER_SITE_OTHER): Payer: BLUE CROSS/BLUE SHIELD | Admitting: Family Medicine

## 2018-01-06 VITALS — BP 118/76 | HR 89 | Temp 98.2°F | Resp 18 | Ht 65.0 in | Wt 137.6 lb

## 2018-01-06 DIAGNOSIS — Z13220 Encounter for screening for lipoid disorders: Secondary | ICD-10-CM

## 2018-01-06 DIAGNOSIS — R87618 Other abnormal cytological findings on specimens from cervix uteri: Secondary | ICD-10-CM | POA: Diagnosis not present

## 2018-01-06 DIAGNOSIS — Z111 Encounter for screening for respiratory tuberculosis: Secondary | ICD-10-CM

## 2018-01-06 DIAGNOSIS — Z3041 Encounter for surveillance of contraceptive pills: Secondary | ICD-10-CM

## 2018-01-06 DIAGNOSIS — Z01419 Encounter for gynecological examination (general) (routine) without abnormal findings: Secondary | ICD-10-CM

## 2018-01-06 DIAGNOSIS — Z124 Encounter for screening for malignant neoplasm of cervix: Secondary | ICD-10-CM

## 2018-01-06 DIAGNOSIS — Z131 Encounter for screening for diabetes mellitus: Secondary | ICD-10-CM

## 2018-01-06 DIAGNOSIS — Z113 Encounter for screening for infections with a predominantly sexual mode of transmission: Secondary | ICD-10-CM | POA: Diagnosis not present

## 2018-01-06 DIAGNOSIS — Z79899 Other long term (current) drug therapy: Secondary | ICD-10-CM

## 2018-01-06 MED ORDER — LEVONORGEST-ETH ESTRAD 91-DAY 0.15-0.03 MG PO TABS: 1.0000 | ORAL_TABLET | Freq: Every day | ORAL | 3 refills | Status: DC

## 2018-01-06 NOTE — Progress Notes (Signed)
Name: Alison Nunez   MRN: [id number]    DOB: March 01, 1996   Date:01/06/2018       Progress Note  Subjective  Chief Complaint  Chief Complaint  Patient presents with  . Annual Exam    HPI  Well Woman: she has not been sexually active in over one year, no vaginal symptoms ( discharge or odor), no dysuria, no vulva lesions, cycles are heavy but controlled with ocp's. No breast problems. No family history of breast, ovarian or colon cancer. No family history of early onset heart disease. She is trying to eat healthier and started going to the gym a couple of weeks ago.   She had a cervical polyp that was labeled as vaginal and was referred to gyn, had a normal pap smear last year, and no intercourse since, we will repeat pap on her next visit   Patient Active Problem List   Diagnosis Date Noted  . Excessive and frequent menstruation with irregular cycle 12/13/2016  . H/O iron deficiency anemia 12/13/2016  . Primary dysmenorrhea 12/13/2016  . Cervical polyp 12/13/2016  . Encounter for surveillance of contraceptive pills 11/10/2015  . Scoliosis 11/10/2015    Past Surgical History:  Procedure Laterality Date  . CLOSED REDUCTION NASAL FRACTURE N/A 04/09/2016   Procedure: CLOSED REDUCTION NASAL FRACTURE;  Surgeon: Geanie Logan, MD;  Location: [facility];  Service: ENT;  Laterality: N/A;  . history of removal of cyst     pilonidal cyst x's 2.  . WISDOM TOOTH EXTRACTION      Family History  Problem Relation Age of Onset  . Diabetes Maternal Grandmother     Social History   Socioeconomic History  . Marital status: Single    Spouse name: Not on file  . Number of children: 0  . Years of education: Not on file  . Highest education level: Bachelor's degree (e.g., BA, AB, BS)  Social Needs  . Financial resource strain: Not hard at all  . Food insecurity - worry: Never true  . Food insecurity - inability: Never true  . Transportation needs - medical: No  . Transportation  needs - non-medical: No  Occupational History  . Occupation: rehab techinician     Comment: [employer]  Tobacco Use  . Smoking status: Never Smoker  . Smokeless tobacco: Never Used  Substance and Sexual Activity  . Alcohol use: Yes    Alcohol/week: 0.6 oz    Types: 1 Glasses of wine per week    Comment: occasionally   . Drug use: No  . Sexual activity: Not Currently    Partners: Male    Birth control/protection: Pill, Condom  Other Topics Concern  . Not on file  Social History Narrative   Graduated  from [employer] May 2018   Exercise Science degree, currently working a Teacher, adult education at [employer] and will apply for grad school - OT Fall 2019     Current Outpatient Medications:  .  levonorgestrel-ethinyl estradiol (SEASONALE,INTROVALE,JOLESSA) 0.15-0.03 MG tablet, TAKE 1 TABLET DAILY, Disp: 91 tablet, Rfl: 0  No Known Allergies   ROS  Constitutional: Negative for fever or weight change.  Respiratory: Negative for cough and shortness of breath.   Cardiovascular: Negative for chest pain or palpitations.  Gastrointestinal: Negative for abdominal pain, no bowel changes.  Musculoskeletal: Negative for gait problem or joint swelling.  Skin: Negative for rash.  Neurological: Negative for dizziness or headache.  No other specific complaints in a complete  review of systems (except as listed in HPI above).  Objective  Vitals:   01/06/18 1333  BP: 118/76  Pulse: 89  Resp: 18  Temp: 98.2 F (36.8 C)  TempSrc: Oral  SpO2: 99%  Weight: 137 lb 9.6 oz (62.4 kg)  Height: 5\' 5"  (1.651 m)    Body mass index is 22.9 kg/m.  Physical Exam  Constitutional: Patient appears well-developed and well-nourished. No distress.  HENT: Head: Normocephalic and atraumatic. Ears: B TMs ok, no erythema or effusion; Nose: Nose normal. Mouth/Throat: Oropharynx is clear and moist. No oropharyngeal exudate.  Eyes: Conjunctivae and EOM are normal. Pupils  are equal, round, and reactive to light. No scleral icterus.  Neck: Normal range of motion. Neck supple. No JVD present. No thyromegaly present.  Cardiovascular: Normal rate, regular rhythm and normal heart sounds.  No murmur heard. No BLE edema. Pulmonary/Chest: Effort normal and breath sounds normal. No respiratory distress. Abdominal: Soft. Bowel sounds are normal, no distension. There is no tenderness. no masses Breast: no lumps or masses, no nipple discharge or rashes FEMALE GENITALIA:  External genitalia normal External urethra normal Vaginal vault normal without discharge or lesions Cervix friable, no polyp, but part of endocevix seem to be exposed Bimanual exam normal without masses RECTAL: no done Musculoskeletal: Normal range of motion, no joint effusions. No gross deformities Neurological: he is alert and oriented to person, place, and time. No cranial nerve deficit. Coordination, balance, strength, speech and gait are normal.  Skin: Skin is warm and dry. No rash noted. No erythema.  Psychiatric: Patient has a normal mood and affect. behavior is normal. Judgment and thought content normal.  PHQ2/9: Depression screen PHQ 2/9 11/10/2015  Decreased Interest 0  Down, Depressed, Hopeless 0  PHQ - 2 Score 0     Fall Risk: Fall Risk  11/10/2015  Falls in the past year? No     Functional Status Survey: Is the patient deaf or have difficulty hearing?: No Does the patient have difficulty seeing, even when wearing glasses/contacts?: No Does the patient have difficulty concentrating, remembering, or making decisions?: No Does the patient have difficulty walking or climbing stairs?: No Does the patient have difficulty dressing or bathing?: No Does the patient have difficulty doing errands alone such as visiting a doctor's office or shopping?: No    Assessment & Plan  1. Well woman exam  Discussed importance of 150 minutes of physical activity weekly, eat two servings of  fish weekly, eat one serving of tree nuts ( cashews, pistachios, pecans, almonds.Marland Kitchen.) every other day, eat 6 servings of fruit/vegetables daily and drink plenty of water and avoid sweet beverages.    - CBC with Differential/Platelet - Comprehensive metabolic panel - Hemoglobin A1c - HIV antibody - Lipid panel - RPR - Hepatitis, Acute - C. trachomatis/N. gonorrhoeae RNA  2. Lipid screening  - Lipid panel  3. Long-term use of high-risk medication  - CBC with Differential/Platelet - Comprehensive metabolic panel  4. Routine screening for STI (sexually transmitted infection)  - HIV antibody - RPR - Hepatitis, Acute - C. trachomatis/N. gonorrhoeae RNA  5. Diabetes screen  - Hemoglobin A1c  6. Encounter for surveillance of contraceptive pills  Sent refill of ocp  7. Screening-pulmonary TB  - QuantiFERON-TB Gold Plus   8. Cervical cancer screening  - Pap IG and Chlamydia/Gonococcus, NAA

## 2018-01-08 LAB — PAP IG AND CT-NG NAA
C. trachomatis RNA, TMA: NOT DETECTED
N. GONORRHOEAE RNA, TMA: NOT DETECTED

## 2018-01-08 LAB — CBC WITH DIFFERENTIAL/PLATELET
Basophils Absolute: 23 cells/uL (ref 0–200)
Basophils Relative: 0.4 %
EOS PCT: 4.2 %
Eosinophils Absolute: 239 cells/uL (ref 15–500)
HCT: 41.4 % (ref 35.0–45.0)
Hemoglobin: 14.2 g/dL (ref 11.7–15.5)
Lymphs Abs: 2098 cells/uL (ref 850–3900)
MCH: 30.4 pg (ref 27.0–33.0)
MCHC: 34.3 g/dL (ref 32.0–36.0)
MCV: 88.7 fL (ref 80.0–100.0)
MPV: 10.9 fL (ref 7.5–12.5)
Monocytes Relative: 5.6 %
Neutro Abs: 3021 cells/uL (ref 1500–7800)
Neutrophils Relative %: 53 %
PLATELETS: 318 10*3/uL (ref 140–400)
RBC: 4.67 10*6/uL (ref 3.80–5.10)
RDW: 12.2 % (ref 11.0–15.0)
TOTAL LYMPHOCYTE: 36.8 %
WBC mixed population: 319 cells/uL (ref 200–950)
WBC: 5.7 10*3/uL (ref 3.8–10.8)

## 2018-01-08 LAB — LIPID PANEL
Cholesterol: 225 mg/dL — ABNORMAL HIGH (ref <200)
HDL: 70 mg/dL (ref >50)
LDL Cholesterol (Calc): 129 mg/dL (calc) — ABNORMAL HIGH
Non-HDL Cholesterol (Calc): 155 mg/dL (calc) — ABNORMAL HIGH (ref <130)
Total CHOL/HDL Ratio: 3.2 (calc) (ref <5.0)
Triglycerides: 148 mg/dL (ref <150)

## 2018-01-08 LAB — HEPATITIS PANEL, ACUTE
HEP B C IGM: NONREACTIVE
HEP C AB: NONREACTIVE
Hep A IgM: NONREACTIVE
Hepatitis B Surface Ag: NONREACTIVE
SIGNAL TO CUT-OFF: 0.05 (ref <1.00)

## 2018-01-08 LAB — COMPREHENSIVE METABOLIC PANEL
AG Ratio: 1.4 (calc) (ref 1.0–2.5)
ALT: 13 U/L (ref 6–29)
AST: 14 U/L (ref 10–30)
Albumin: 4.8 g/dL (ref 3.6–5.1)
Alkaline phosphatase (APISO): 39 U/L (ref 33–115)
BUN: 12 mg/dL (ref 7–25)
CO2: 25 mmol/L (ref 20–32)
Calcium: 9.8 mg/dL (ref 8.6–10.2)
Chloride: 100 mmol/L (ref 98–110)
Creat: 0.95 mg/dL (ref 0.50–1.10)
GLUCOSE: 80 mg/dL (ref 65–99)
Globulin: 3.5 g/dL (calc) (ref 1.9–3.7)
Potassium: 3.8 mmol/L (ref 3.5–5.3)
SODIUM: 137 mmol/L (ref 135–146)
TOTAL PROTEIN: 8.3 g/dL — AB (ref 6.1–8.1)
Total Bilirubin: 0.6 mg/dL (ref 0.2–1.2)

## 2018-01-08 LAB — QUANTIFERON-TB GOLD PLUS
Mitogen-NIL: >10 IU/mL
NIL: 0.03 [IU]/mL
QUANTIFERON-TB GOLD PLUS: NEGATIVE
TB1-NIL: <0 IU/mL
TB2-NIL: 0 [IU]/mL

## 2018-01-08 LAB — HIV ANTIBODY (ROUTINE TESTING W REFLEX): HIV: NONREACTIVE

## 2018-01-08 LAB — HEMOGLOBIN A1C
EAG (MMOL/L): 5.7 (calc)
Hgb A1c MFr Bld: 5.2 % of total Hgb (ref <5.7)
Mean Plasma Glucose: 103 (calc)

## 2018-01-08 LAB — RPR: RPR: NONREACTIVE

## 2018-02-14 ENCOUNTER — Other Ambulatory Visit: Payer: Self-pay

## 2018-02-14 ENCOUNTER — Ambulatory Visit
Admission: EM | Admit: 2018-02-14 | Discharge: 2018-02-14 | Disposition: A | Discharge status: Home / Self Care | Payer: BLUE CROSS/BLUE SHIELD

## 2018-02-14 DIAGNOSIS — L249 Irritant contact dermatitis, unspecified cause: Secondary | ICD-10-CM | POA: Diagnosis not present

## 2018-02-14 DIAGNOSIS — R21 Rash and other nonspecific skin eruption: Secondary | ICD-10-CM | POA: Diagnosis not present

## 2018-02-14 NOTE — Discharge Instructions (Signed)
Please avoid scented soaps, take Benadryl 25-50 mg every 6 hours as needed for itching.

## 2018-02-14 NOTE — ED Triage Notes (Signed)
Patient c/o a rash on both arms, she started experiencing itching Thursday. Patient states she noticed the bumps yesterday. 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

## 2018-02-14 NOTE — ED Provider Notes (Signed)
[facility]    CSN: [id number] Arrival date & time: 02/14/18  0801     History   Chief Complaint Chief Complaint  Patient presents with  . Rash    HPI Alison Nunez is a 22 y.o. female presents to the urgent care facility for evaluation of a skin rash.  Patient has rash on her upper extremities and chest.  She denies any other symptoms.  Patient states Thursday she noticed pruritic rash after using a new scented soap.  Friday she developed a rash on her arms that was pruritic.  She has not take any medications.  Rash seems to be improving.  Employer wanted her to come be evaluated.  She denies any other symptoms.  No rash intraorally on her palms or soles.  She denies any fevers or cold symptoms.  She denies any pain or discomfort.  Rashes much improved from yesterday.  HPI  Past Medical History:  Diagnosis Date  . Genital warts   . Primary dysmenorrhea   . Wears contact lenses     Patient Active Problem List   Diagnosis Date Noted  . Excessive and frequent menstruation with irregular cycle 12/13/2016  . H/O iron deficiency anemia 12/13/2016  . Primary dysmenorrhea 12/13/2016  . Cervical polyp 12/13/2016  . Encounter for surveillance of contraceptive pills 11/10/2015  . Scoliosis 11/10/2015    Past Surgical History:  Procedure Laterality Date  . CLOSED REDUCTION NASAL FRACTURE N/A 04/09/2016   Procedure: CLOSED REDUCTION NASAL FRACTURE;  Surgeon: Geanie Logan, MD;  Location: [facility];  Service: ENT;  Laterality: N/A;  . history of removal of cyst     pilonidal cyst x's 2.  . WISDOM TOOTH EXTRACTION      OB History    Gravida Para Term Preterm AB Living   0 0 0 0 0 0   SAB TAB Ectopic Multiple Live Births   0 0 0 0 0       Home Medications    Prior to Admission medications   Medication Sig Start Date End Date Taking? Authorizing Provider  levonorgestrel-ethinyl estradiol (SEASONALE,INTROVALE,JOLESSA) 0.15-0.03 MG tablet Take 1 tablet by  mouth daily. 01/06/18  Yes Alba Cory, MD    Family History Family History  Problem Relation Age of Onset  . Diabetes Maternal Grandmother     Social History Social History   Tobacco Use  . Smoking status: Never Smoker  . Smokeless tobacco: Never Used  Substance Use Topics  . Alcohol use: Yes    Alcohol/week: 0.6 oz    Types: 1 Glasses of wine per week    Comment: occasionally   . Drug use: No     Allergies   Patient has no known allergies.   Review of Systems Review of Systems  Constitutional: Negative for fever.  HENT: Negative for congestion and trouble swallowing.   Musculoskeletal: Negative for neck pain.  Skin: Positive for rash.     Physical Exam Triage Vital Signs ED Triage Vitals [02/14/18 0815]  Enc Vitals Group     BP 135/70     Pulse Rate 95     Resp      Temp 98.2 F (36.8 C)     Temp Source Oral     SpO2 100 %     Weight 142 lb (64.4 kg)     Height 5\' 5"  (1.651 m)     Head Circumference      Peak Flow      Pain Score  Pain Loc      Pain Edu?      Excl. in GC?    No data found.  Updated Vital Signs BP 135/70 (BP Location: Left Arm)   Pulse 95   Temp 98.2 F (36.8 C) (Oral)   Ht 5\' 5"  (1.651 m)   Wt 142 lb (64.4 kg)   LMP 01/31/2018 (Approximate)   SpO2 100%   BMI 23.63 kg/m   Visual Acuity Right Eye Distance:   Left Eye Distance:   Bilateral Distance:    Right Eye Near:   Left Eye Near:    Bilateral Near:     Physical Exam  Constitutional: She is oriented to person, place, and time. She appears well-developed and well-nourished.  HENT:  Head: Normocephalic and atraumatic.  Neck: Normal range of motion.  Cardiovascular: Normal rate.  Pulmonary/Chest: Effort normal. No respiratory distress.  Neurological: She is alert and oriented to person, place, and time.  Skin: Rash noted.  Examination of bilateral forearm shows a erythematous maculopapular rash that is very mild on the volar and dorsal aspects of both  forearms.  There is also very splotchy rash on the top of her chest.  No other rash throughout the abdomen, back or lower extremities.  No rash on the face, and intraorally or on her palms or soles.  Rash is nontender, not erythematous.  No skin lesions.     UC Treatments / Results  Labs (all labs ordered are listed, but only abnormal results are displayed) Labs Reviewed - No data to display  EKG  EKG Interpretation None       Radiology No results found.  Procedures Procedures (including critical care time)  Medications Ordered in UC Medications - No data to display   Initial Impression / Assessment and Plan / UC Course  I have reviewed the triage vital signs and the nursing notes.  Pertinent labs & imaging results that were available during my care of the patient were reviewed by me and considered in my medical decision making (see chart for details).     Patient educated on contact dermatitis.  She will avoid new scented soap and go to is more sensitive skin soap.  She will use Benadryl as needed for any itching.  She is educated on signs and symptoms return to clinic for.  Final Clinical Impressions(s) / UC Diagnoses   Final diagnoses:  Irritant contact dermatitis, unspecified trigger    ED Discharge Orders    None        Evon SlackGaines, Thomas C, [place] JerseyPA-C 02/14/18 [phone]

## 2018-05-25 ENCOUNTER — Other Ambulatory Visit: Payer: Self-pay | Admitting: Family Medicine

## 2018-05-25 DIAGNOSIS — Z111 Encounter for screening for respiratory tuberculosis: Secondary | ICD-10-CM | POA: Diagnosis not present

## 2018-05-27 LAB — QUANTIFERON-TB GOLD PLUS
NIL: 0.03 IU/mL
QUANTIFERON-TB GOLD PLUS: NEGATIVE
TB1-NIL: 0.01 [IU]/mL
TB2-NIL: <0 IU/mL

## 2018-06-16 ENCOUNTER — Encounter: Payer: Self-pay | Admitting: Nurse Practitioner

## 2018-06-16 ENCOUNTER — Ambulatory Visit (INDEPENDENT_AMBULATORY_CARE_PROVIDER_SITE_OTHER): Payer: BLUE CROSS/BLUE SHIELD | Admitting: Nurse Practitioner

## 2018-06-16 VITALS — BP 112/70 | HR 82 | Temp 98.3°F | Resp 16 | Ht 65.0 in | Wt 140.8 lb

## 2018-06-16 DIAGNOSIS — Z0000 Encounter for general adult medical examination without abnormal findings: Secondary | ICD-10-CM

## 2018-06-16 DIAGNOSIS — E785 Hyperlipidemia, unspecified: Secondary | ICD-10-CM | POA: Diagnosis not present

## 2018-06-16 NOTE — Patient Instructions (Signed)
Cholesterol Cholesterol is a fat. Your body needs a small amount of cholesterol. Cholesterol (plaque) may build up in your blood vessels (arteries). That makes you more likely to have a heart attack or stroke. You cannot feel your cholesterol level. Having a blood test is the only way to find out if your level is high. Keep your test results. Work with your doctor to keep your cholesterol at a good level. What do the results mean?  Total cholesterol is how much cholesterol is in your blood.  LDL is bad cholesterol. This is the type that can build up. Try to have low LDL.  HDL is good cholesterol. It cleans your blood vessels and carries LDL away. Try to have high HDL.  Triglycerides are fat that the body can store or burn for energy. What are good levels of cholesterol?  Total cholesterol below 200.  LDL below 100 is good for people who have health risks. LDL below 70 is good for people who have very high risks.  HDL above 40 is good. It is best to have HDL of 60 or higher.  Triglycerides below 150. How can I lower my cholesterol? Diet Follow your diet program as told by your doctor.  Choose fish, white meat chicken, or turkey that is roasted or baked. Try not to eat red meat, fried foods, sausage, or lunch meats.  Eat lots of fresh fruits and vegetables.  Choose whole grains, beans, pasta, potatoes, and cereals.  Choose olive oil, corn oil, or canola oil. Only use small amounts.  Try not to eat butter, mayonnaise, shortening, or palm kernel oils.  Try not to eat foods with trans fats.  Choose low-fat or nonfat dairy foods. ? Drink skim or nonfat milk. ? Eat low-fat or nonfat yogurt and cheeses. ? Try not to drink whole milk or cream. ? Try not to eat ice cream, egg yolks, or full-fat cheeses.  Healthy desserts include angel food cake, ginger snaps, animal crackers, hard candy, popsicles, and low-fat or nonfat frozen yogurt. Try not to eat pastries, cakes, pies, and  cookies.  Exercise Follow your exercise program as told by your doctor.  Be more active. Try gardening, walking, and taking the stairs.  Ask your doctor about ways that you can be more active.  Medicine  Take over-the-counter and prescription medicines only as told by your doctor. This information is not intended to replace advice given to you by your health care provider. Make sure you discuss any questions you have with your health care provider. Document Released: 03/07/2009 Document Revised: 07/10/2016 Document Reviewed: 06/20/2016 Elsevier Interactive Patient Education  2018 Elsevier Inc.  

## 2018-06-16 NOTE — Progress Notes (Addendum)
Name: Alison Nunez   MRN: [id number]    DOB: 09/26/1996   Date:06/16/2018       Progress Note  Subjective  Chief Complaint  Chief Complaint  Patient presents with  . Annual Exam    college paperwork    HPI  Patient presents for annual CPE.  Diet: eats anything she wants, eats three times a day, occasionally missed breakfast. Tries to cook 3 times week and always has a vegetable then. Sts overall has improved eating since earlier this year but still not that great.  -drinks half a gallon a day.  - Caffeine- one cup of coffee a day Exercise: works out 3-4 times a week, 60 minutes a weeks. Likes to do cardio   No complaints or concerns today. Needs paperwork filled out- [facility].  USPSTF grade A and B recommendations  Depression:  [facility] 06/16/2018 01/06/2018 11/10/2015  Decreased Interest 0 0 0  Down, Depressed, Hopeless 0 0 0  PHQ - 2 Score 0 0 0  Altered sleeping 0 - -  Tired, decreased energy 0 - -  Change in appetite 0 - -  Feeling bad or failure about yourself  0 - -  Trouble concentrating 0 - -  Moving slowly or fidgety/restless 0 - -  Suicidal thoughts 0 - -  PHQ-9 Score 0 - -  Difficult doing work/chores Not difficult at all - -   Hypertension: BP Readings from Last 3 Encounters:  06/16/18 112/70  02/14/18 135/70  01/06/18 118/76   Obesity: Wt Readings from Last 3 Encounters:  06/16/18 140 lb 12.8 oz (63.9 kg)  02/14/18 142 lb (64.4 kg)  01/06/18 137 lb 9.6 oz (62.4 kg)   BMI Readings from Last 3 Encounters:  06/16/18 23.43 kg/m  02/14/18 23.63 kg/m  01/06/18 22.90 kg/m    Alcohol: occasionally, socially  Tobacco use: denies HIV, hep B, hep C: declines  STD testing and prevention (chl/gon/syphilis): declines Intimate partner violence: denies Sexual History/Pain during Intercourse: denies  Menstrual History/LMP/Abnormal Bleeding: denies, bc daily tablet   Cervical cancer screening: 2018-  negative next due 2021   Lipids:  Lab Results  Component Value Date   CHOL 225 (H) 01/06/2018   CHOL 199 12/13/2016   Lab Results  Component Value Date   HDL 70 01/06/2018   HDL 69 12/13/2016   Lab Results  Component Value Date   LDLCALC 129 (H) 01/06/2018   LDLCALC 115 (H) 12/13/2016   Lab Results  Component Value Date   TRIG 148 01/06/2018   TRIG 77 12/13/2016   Lab Results  Component Value Date   CHOLHDL 3.2 01/06/2018   CHOLHDL 2.9 12/13/2016   No results found for: LDLDIRECT  Glucose:  Glucose, Bld  Date Value Ref Range Status  01/06/2018 80 65 - 99 mg/dL Final    Comment:    .            Fasting reference interval .   12/13/2016 76 65 - 99 mg/dL Final    Skin cancer: doesn't use sunscnreen, not out in the sun a lot   Patient Active Problem List   Diagnosis Date Noted  . Excessive and frequent menstruation with irregular cycle 12/13/2016  . H/O iron deficiency anemia 12/13/2016  . Primary dysmenorrhea 12/13/2016  . Cervical polyp 12/13/2016  . Encounter for surveillance of contraceptive pills 11/10/2015  . Scoliosis 11/10/2015    Past Surgical History:  Procedure Laterality Date  .  CLOSED REDUCTION NASAL FRACTURE N/A 04/09/2016   Procedure: CLOSED REDUCTION NASAL FRACTURE;  Surgeon: Geanie Logan, MD;  Location: [facility];  Service: ENT;  Laterality: N/A;  . history of removal of cyst     pilonidal cyst x's 2.  . WISDOM TOOTH EXTRACTION      Family History  Problem Relation Age of Onset  . Diabetes Maternal Grandmother     Social History   Socioeconomic History  . Marital status: Single    Spouse name: Not on file  . Number of children: 0  . Years of education: Not on file  . Highest education level: Bachelor's degree (e.g., BA, AB, BS)  Occupational History  . Occupation: rehab techinician     Comment: [employer]  Social Needs  . Financial resource strain: Not hard at all  . Food insecurity:    Worry: Never true     Inability: Never true  . Transportation needs:    Medical: No    Non-medical: No  Tobacco Use  . Smoking status: Never Smoker  . Smokeless tobacco: Never Used  Substance and Sexual Activity  . Alcohol use: Yes    Alcohol/week: 0.6 oz    Types: 1 Glasses of wine per week    Comment: occasionally   . Drug use: No  . Sexual activity: Not Currently    Partners: Male    Birth control/protection: Pill, Condom  Lifestyle  . Physical activity:    Days per week: 3 days    Minutes per session: 60 min  . Stress: Not at all  Relationships  . Social connections:    Talks on phone: More than three times a week    Gets together: More than three times a week    Attends religious service: More than 4 times per year    Active member of club or organization: No    Attends meetings of clubs or organizations: Never    Relationship status: Never married  . Intimate partner violence:    Fear of current or ex partner: No    Emotionally abused: No    Physically abused: No    Forced sexual activity: No  Other Topics Concern  . Not on file  Social History Narrative   Graduated  from [employer] May 2018   Exercise Science degree, currently working a Teacher, adult education at [employer] and will apply for grad school - OT Fall 2019     Current Outpatient Medications:  .  levonorgestrel-ethinyl estradiol (SEASONALE,INTROVALE,JOLESSA) 0.15-0.03 MG tablet, Take 1 tablet by mouth daily., Disp: 91 tablet, Rfl: 3  No Known Allergies   ROS  Constitutional: Negative for fever or weight change.  Respiratory: Negative for cough and shortness of breath.   Cardiovascular: Negative for chest pain or palpitations.  Gastrointestinal: Negative for abdominal pain, no bowel changes.  Musculoskeletal: Negative for gait problem or joint swelling.  Skin: Negative for rash.  Neurological: Negative for dizziness or headache.  No other specific complaints in a complete review of  systems (except as listed in HPI above).   Objective  Vitals:   06/16/18 0958  BP: 112/70  Pulse: 82  Resp: 16  Temp: 98.3 F (36.8 C)  TempSrc: Oral  SpO2: 99%  Weight: 140 lb 12.8 oz (63.9 kg)  Height: 5\' 5"  (1.651 m)    Body mass index is 23.43 kg/m.  Physical Exam  Constitutional: Patient appears well-developed and well-nourished. No distress.  HENT: Head: Normocephalic and  atraumatic. Ears: B TMs ok, no erythema or effusion; Nose: Nose normal. Mouth/Throat: Oropharynx is clear and moist. No oropharyngeal exudate.  Eyes: Conjunctivae and EOM are normal. Pupils are equal, round, and reactive to light. No scleral icterus.  Neck: Normal range of motion. Neck supple. No JVD present. No thyromegaly present.  Cardiovascular: Normal rate, regular rhythm and normal heart sounds.  No murmur heard. No BLE edema. Pulmonary/Chest: Effort normal and breath sounds normal. No respiratory distress. Abdominal: Soft. Bowel sounds are normal, no distension. There is no tenderness. no masses Breast: no lumps or masses, no nipple discharge or rashes Musculoskeletal: Normal range of motion, no joint effusions. No gross deformities Neurological: he is alert and oriented to person, place, and time. No cranial nerve deficit. Coordination, balance, strength, speech and gait are normal.  Skin: Skin is warm and dry. No rash noted. No erythema.  Psychiatric: Patient has a normal mood and affect. behavior is normal. Judgment and thought content normal.   Recent Results (from the past 2160 hour(s))  QuantiFERON-TB Gold Plus     Status: None   Collection Time: 05/25/18  8:14 AM  Result Value Ref Range   QuantiFERON-TB Gold Plus NEGATIVE NEGATIVE    Comment: Negative test result. M. tuberculosis complex  infection unlikely.    NIL 0.03 IU/mL   Mitogen-NIL >10.00 IU/mL   TB1-NIL 0.01 IU/mL   TB2-NIL <0.00 IU/mL    Comment: . The Nil tube value reflects the background interferon gamma immune  response of the patient's blood sample. This value has been subtracted from the patient's displayed TB and Mitogen results. . Lower than expected results with the Mitogen tube prevent false-negative Quantiferon readings by detecting a patient with a potential immune suppressive condition and/or suboptimal pre-analytical specimen handling. . The TB1 Antigen tube is coated with the M. tuberculosis-specific antigens designed to elicit responses from TB antigen primed CD4+ helper T-lymphocytes. . The TB2 Antigen tube is coated with the M. tuberculosis-specific antigens designed to elicit responses from TB antigen primed CD4+ helper and CD8+ cytotoxic T-lymphocytes. . For additional information, please refer to https://education.questdiagnostics.com/faq/FAQ204 (This link is being provided for informational/ educational purposes only.) .      Fall Risk: Fall Risk  06/16/2018 01/06/2018 11/10/2015  Falls in the past year? No No No     Assessment & Plan   1. Routine general medical examination at a health care facility Discussed diet, activity, healthy living, STD prevention  2. Dyslipidemia Discussed diet    -USPSTF grade A and B recommendations reviewed with patient; age-appropriate recommendations, preventive care, screening tests, etc discussed and encouraged; healthy living encouraged; see AVS for patient education given to patient -Discussed importance of 150 minutes of physical activity weekly, eat two servings of fish weekly, eat one serving of tree nuts ( cashews, pistachios, pecans, almonds.Marland Kitchen) every other day, eat 6 servings of fruit/vegetables daily and drink plenty of water and avoid sweet beverages.  -Red flags and when to present for emergency care or RTC including fever >101.57F, chest pain, shortness of breath, new/worsening/un-resolving symptoms,  reviewed with patient at time of visit. Follow up and care instructions discussed and provided in  AVS.  --------------------------------------------- I have reviewed this encounter including the documentation in this note and/or discussed this patient with the provider, Sharyon Cable DNP AGNP-C. I am certifying that I agree with the content of this note as supervising physician. Baruch Gouty, MD [facility] 06/17/2018, 4:57 PM

## 2019-02-19 ENCOUNTER — Other Ambulatory Visit: Payer: Self-pay | Admitting: Family Medicine

## 2019-02-19 DIAGNOSIS — Z3041 Encounter for surveillance of contraceptive pills: Secondary | ICD-10-CM

## 2019-02-19 NOTE — Telephone Encounter (Signed)
Refill request for general medication: Seasonale 0.15-0.03 mg  Last office visit: 06/16/2018  Last physical exam: 06/16/2018  Follow-ups on file. 06/18/2019

## 2019-06-18 ENCOUNTER — Encounter: Payer: BLUE CROSS/BLUE SHIELD | Admitting: Family Medicine

## 2019-08-27 ENCOUNTER — Other Ambulatory Visit (HOSPITAL_COMMUNITY)
Admission: RE | Admit: 2019-08-27 | Discharge: 2019-08-27 | Disposition: A | Discharge status: Home / Self Care | Payer: BC Managed Care – PPO | Source: Ambulatory Visit | Attending: Family Medicine | Admitting: Family Medicine

## 2019-08-27 ENCOUNTER — Other Ambulatory Visit: Payer: Self-pay

## 2019-08-27 ENCOUNTER — Ambulatory Visit (INDEPENDENT_AMBULATORY_CARE_PROVIDER_SITE_OTHER): Payer: BC Managed Care – PPO | Admitting: Family Medicine

## 2019-08-27 ENCOUNTER — Encounter: Payer: Self-pay | Admitting: Family Medicine

## 2019-08-27 VITALS — BP 116/72 | HR 73 | Temp 97.1°F | Resp 16 | Ht 65.0 in | Wt 140.9 lb

## 2019-08-27 DIAGNOSIS — Z01419 Encounter for gynecological examination (general) (routine) without abnormal findings: Secondary | ICD-10-CM

## 2019-08-27 DIAGNOSIS — Z131 Encounter for screening for diabetes mellitus: Secondary | ICD-10-CM | POA: Diagnosis not present

## 2019-08-27 DIAGNOSIS — Z124 Encounter for screening for malignant neoplasm of cervix: Secondary | ICD-10-CM | POA: Diagnosis not present

## 2019-08-27 DIAGNOSIS — Z23 Encounter for immunization: Secondary | ICD-10-CM | POA: Diagnosis not present

## 2019-08-27 DIAGNOSIS — Z113 Encounter for screening for infections with a predominantly sexual mode of transmission: Secondary | ICD-10-CM

## 2019-08-27 DIAGNOSIS — Z13220 Encounter for screening for lipoid disorders: Secondary | ICD-10-CM

## 2019-08-27 DIAGNOSIS — Z3041 Encounter for surveillance of contraceptive pills: Secondary | ICD-10-CM

## 2019-08-27 NOTE — Patient Instructions (Signed)
Preventive Care 21-24 Years Old, Female Preventive care refers to visits with your health care provider and lifestyle choices that can promote health and wellness. This includes:  A yearly physical exam. This may also be called an annual well check.  Regular dental visits and eye exams.  Immunizations.  Screening for certain conditions.  Healthy lifestyle choices, such as eating a healthy diet, getting regular exercise, not using drugs or products that contain nicotine and tobacco, and limiting alcohol use. What can I expect for my preventive care visit? Physical exam Your health care provider will check your:  Height and weight. This may be used to calculate body mass index (BMI), which tells if you are at a healthy weight.  Heart rate and blood pressure.  Skin for abnormal spots. Counseling Your health care provider may ask you questions about your:  Alcohol, tobacco, and drug use.  Emotional well-being.  Home and relationship well-being.  Sexual activity.  Eating habits.  Work and work environment.  Method of birth control.  Menstrual cycle.  Pregnancy history. What immunizations do I need?  Influenza (flu) vaccine  This is recommended every year. Tetanus, diphtheria, and pertussis (Tdap) vaccine  You may need a Td booster every 10 years. Varicella (chickenpox) vaccine  You may need this if you have not been vaccinated. Human papillomavirus (HPV) vaccine  If recommended by your health care provider, you may need three doses over 6 months. Measles, mumps, and rubella (MMR) vaccine  You may need at least one dose of MMR. You may also need a second dose. Meningococcal conjugate (MenACWY) vaccine  One dose is recommended if you are age 19-21 years and a first-year college student living in a residence hall, or if you have one of several medical conditions. You may also need additional booster doses. Pneumococcal conjugate (PCV13) vaccine  You may need  this if you have certain conditions and were not previously vaccinated. Pneumococcal polysaccharide (PPSV23) vaccine  You may need one or two doses if you smoke cigarettes or if you have certain conditions. Hepatitis A vaccine  You may need this if you have certain conditions or if you travel or work in places where you may be exposed to hepatitis A. Hepatitis B vaccine  You may need this if you have certain conditions or if you travel or work in places where you may be exposed to hepatitis B. Haemophilus influenzae type b (Hib) vaccine  You may need this if you have certain conditions. You may receive vaccines as individual doses or as more than one vaccine together in one shot (combination vaccines). Talk with your health care provider about the risks and benefits of combination vaccines. What tests do I need?  Blood tests  Lipid and cholesterol levels. These may be checked every 5 years starting at age 20.  Hepatitis C test.  Hepatitis B test. Screening  Diabetes screening. This is done by checking your blood sugar (glucose) after you have not eaten for a while (fasting).  Sexually transmitted disease (STD) testing.  BRCA-related cancer screening. This may be done if you have a family history of breast, ovarian, tubal, or peritoneal cancers.  Pelvic exam and Pap test. This may be done every 3 years starting at age 21. Starting at age 30, this may be done every 5 years if you have a Pap test in combination with an HPV test. Talk with your health care provider about your test results, treatment options, and if necessary, the need for more tests.   Follow these instructions at home: Eating and drinking   Eat a diet that includes fresh fruits and vegetables, whole grains, lean protein, and low-fat dairy.  Take vitamin and mineral supplements as recommended by your health care provider.  Do not drink alcohol if: ? Your health care provider tells you not to drink. ? You are  pregnant, may be pregnant, or are planning to become pregnant.  If you drink alcohol: ? Limit how much you have to 0-1 drink a day. ? Be aware of how much alcohol is in your drink. In the [place] one drink equals one 12 oz bottle of beer (355 mL), one 5 oz glass of wine (148 mL), or one 1 oz glass of hard liquor (44 mL). Lifestyle  Take daily care of your teeth and gums.  Stay active. Exercise for at least 30 minutes on 5 or more days each week.  Do not use any products that contain nicotine or tobacco, such as cigarettes, e-cigarettes, and chewing tobacco. If you need help quitting, ask your health care provider.  If you are sexually active, practice safe sex. Use a condom or other form of birth control (contraception) in order to prevent pregnancy and STIs (sexually transmitted infections). If you plan to become pregnant, see your health care provider for a preconception visit. What's next?  Visit your health care provider once a year for a well check visit.  Ask your health care provider how often you should have your eyes and teeth checked.  Stay up to date on all vaccines. This information is not intended to replace advice given to you by your health care provider. Make sure you discuss any questions you have with your health care provider. Document Released: 02/04/2002 Document Revised: 08/20/2018 Document Reviewed: 08/20/2018 Elsevier Patient Education  2020 [employer].

## 2019-08-27 NOTE — Progress Notes (Signed)
Name: Alison Nunez   MRN: [id number]    DOB: 18-Apr-1996   Date:08/27/2019       Progress Note  Subjective  Chief Complaint  Chief Complaint  Patient presents with  . Well woman exam    HPI  Patient presents for annual CPE.  Diet: snacking more often Exercise: not as active as he used to be.   USPSTF grade A and B recommendations    Office Visit from 08/27/2019 in [facility]  AUDIT-C Score  1     Depression: Phq 9 is  negative Depression screen [facility] 2/9 08/27/2019 06/16/2018 01/06/2018 11/10/2015  Decreased Interest 0 0 0 0  Down, Depressed, Hopeless 0 0 0 0  PHQ - 2 Score 0 0 0 0  Altered sleeping 0 0 - -  Tired, decreased energy 0 0 - -  Change in appetite 0 0 - -  Feeling bad or failure about yourself  0 0 - -  Trouble concentrating 0 0 - -  Moving slowly or fidgety/restless 0 0 - -  Suicidal thoughts 0 0 - -  PHQ-9 Score 0 0 - -  Difficult doing work/chores Not difficult at all Not difficult at all - -   Hypertension: BP Readings from Last 3 Encounters:  08/27/19 116/72  06/16/18 112/70  02/14/18 135/70   Obesity: Wt Readings from Last 3 Encounters:  08/27/19 140 lb 14.4 oz (63.9 kg)  06/16/18 140 lb 12.8 oz (63.9 kg)  02/14/18 142 lb (64.4 kg)   BMI Readings from Last 3 Encounters:  08/27/19 23.45 kg/m  06/16/18 23.43 kg/m  02/14/18 23.63 kg/m     Hep C Screening: today  STD testing and prevention (HIV/chl/gon/syphilis): today  Intimate partner violence: negative screen  Sexual History/Pain during Intercourse: no pain or discharge  Menstrual History/LMP/Abnormal Bleeding: cycles are regular and takes pills like prescribed and using condoms Incontinence Symptoms: no problems  Breast cancer:  - BRCA gene screening: N/A  Osteoporosis prevention: discussed high calcium and vitamin D diet   Cervical cancer screening: today   Skin cancer: discussed atypical moles  Advanced Care Planning: A voluntary discussion about advance care  planning including the explanation and discussion of advance directives.  Discussed health care proxy and Living will, and the patient was able to identify a health care proxy as mother .  Patient does not have a living will at present time.   Lipids: Lab Results  Component Value Date   CHOL 225 (H) 01/06/2018   CHOL 199 12/13/2016   Lab Results  Component Value Date   HDL 70 01/06/2018   HDL 69 12/13/2016   Lab Results  Component Value Date   LDLCALC 129 (H) 01/06/2018   LDLCALC 115 (H) 12/13/2016   Lab Results  Component Value Date   TRIG 148 01/06/2018   TRIG 77 12/13/2016   Lab Results  Component Value Date   CHOLHDL 3.2 01/06/2018   CHOLHDL 2.9 12/13/2016   No results found for: LDLDIRECT  Glucose: Glucose, Bld  Date Value Ref Range Status  01/06/2018 80 65 - 99 mg/dL Final    Comment:    .            Fasting reference interval .   12/13/2016 76 65 - 99 mg/dL Final    Patient Active Problem List   Diagnosis Date Noted  . Excessive and frequent menstruation with irregular cycle 12/13/2016  . H/O iron deficiency anemia 12/13/2016  . Primary dysmenorrhea 12/13/2016  .  Cervical polyp 12/13/2016  . Encounter for surveillance of contraceptive pills 11/10/2015  . Scoliosis 11/10/2015    Past Surgical History:  Procedure Laterality Date  . CLOSED REDUCTION NASAL FRACTURE N/A 04/09/2016   Procedure: CLOSED REDUCTION NASAL FRACTURE;  Surgeon: Clyde Canterbury, MD;  Location: [place];  Service: ENT;  Laterality: N/A;  . history of removal of cyst     pilonidal cyst x's 2.  . WISDOM TOOTH EXTRACTION      Family History  Problem Relation Age of Onset  . Diabetes Maternal Grandmother   . Heart disease Paternal Grandmother     Social History   Socioeconomic History  . Marital status: Single    Spouse name: Not on file  . Number of children: 0  . Years of education: Not on file  . Highest education level: Bachelor's degree (e.g., BA, AB, BS)   Occupational History  . Occupation: rehab techinician     Comment: [employer]  . Financial resource strain: Not hard at all  . Food insecurity    Worry: Never true    Inability: Never true  . Transportation needs    Medical: No    Non-medical: No  Tobacco Use  . Smoking status: Never Smoker  . Smokeless tobacco: Never Used  Substance and Sexual Activity  . Alcohol use: Yes    Alcohol/week: 1.0 standard drinks    Types: 1 Glasses of wine per week    Comment: occasionally   . Drug use: No  . Sexual activity: Yes    Partners: Male    Birth control/protection: Pill, Condom  Lifestyle  . Physical activity    Days per week: 0 days    Minutes per session: 0 min  . Stress: Not at all  Relationships  . Social connections    Talks on phone: More than three times a week    Gets together: More than three times a week    Attends religious service: More than 4 times per year    Active member of club or organization: No    Attends meetings of clubs or organizations: Never    Relationship status: Never married  . Intimate partner violence    Fear of current or ex partner: No    Emotionally abused: No    Physically abused: No    Forced sexual activity: No  Other Topics Concern  . Not on file  Social History Narrative   Graduated  from [employer] May 2018   Exercise Science degree, currently working a Scientific laboratory technician at [employer] and will apply for grad school - OT Fall 2019     Current Outpatient Medications:  .  levonorgestrel-ethinyl estradiol (SEASONALE,INTROVALE,JOLESSA) 0.15-0.03 MG tablet, TAKE 1 TABLET DAILY, Disp: 91 tablet, Rfl: 4  No Known Allergies   ROS  Constitutional: Negative for fever or weight change.  Respiratory: Negative for cough and shortness of breath.   Cardiovascular: Negative for chest pain or palpitations.  Gastrointestinal: Negative for abdominal pain, no bowel changes.  Musculoskeletal:  Negative for gait problem or joint swelling.  Skin: Negative for rash.  Neurological: Negative for dizziness or headache.  No other specific complaints in a complete review of systems (except as listed in HPI above).  Objective  Vitals:   08/27/19 0954  BP: 116/72  Pulse: 73  Resp: 16  Temp: (!) 97.1 F (36.2 C)  TempSrc: Temporal  SpO2: 99%  Weight: 140 lb 14.4 oz (63.9  kg)  Height: 5' 5"  (1.651 m)    Body mass index is 23.45 kg/m.  Physical Exam  Constitutional: Patient appears well-developed and well-nourished. No distress.  HENT: Head: Normocephalic and atraumatic. Ears: B TMs ok, no erythema or effusion; Nose: Nose normal. Mouth/Throat: Oropharynx is clear and moist. No oropharyngeal exudate.  Eyes: Conjunctivae and EOM are normal. Pupils are equal, round, and reactive to light. No scleral icterus.  Neck: Normal range of motion. Neck supple. No JVD present. No thyromegaly present.  Cardiovascular: Normal rate, regular rhythm and normal heart sounds.  No murmur heard. No BLE edema. Pulmonary/Chest: Effort normal and breath sounds normal. No respiratory distress. Abdominal: Soft. Bowel sounds are normal, no distension. There is no tenderness. no masses Breast: no lumps or masses, no nipple discharge or rashes FEMALE GENITALIA:  External genitalia normal External urethra normal Vaginal vault normal without discharge or lesions Cervix normal without discharge or lesions Bimanual exam normal without masses RECTAL: no rectal masses or hemorrhoids Musculoskeletal: Normal range of motion, no joint effusions. No gross deformities Neurological: he is alert and oriented to person, place, and time. No cranial nerve deficit. Coordination, balance, strength, speech and gait are normal.  Skin: Skin is warm and dry. No rash noted. No erythema.  Psychiatric: Patient has a normal mood and affect. behavior is normal. Judgment and thought content normal.  Fall Risk: Fall Risk  08/27/2019  06/16/2018 01/06/2018 11/10/2015  Falls in the past year? 0 No No No  Number falls in past yr: 0 - - -  Injury with Fall? 0 - - -     Functional Status Survey: Is the patient deaf or have difficulty hearing?: No Does the patient have difficulty seeing, even when wearing glasses/contacts?: No Does the patient have difficulty concentrating, remembering, or making decisions?: No Does the patient have difficulty walking or climbing stairs?: No Does the patient have difficulty dressing or bathing?: No Does the patient have difficulty doing errands alone such as visiting a doctor's office or shopping?: No   Assessment & Plan  1. Well woman exam  - COMPLETE METABOLIC PANEL WITH GFR - CBC with Differential/Platelet  2. Routine screening for STI (sexually transmitted infection)   3. Cervical cancer screening  - Cytology - PAP  4. Need for vaccination for meningococcus  - Meningococcal MCV4O(Menveo)  5. Needs flu shot  - Flu Vaccine QUAD 36+ mos IM  6. Lipid screening  - Lipid panel  7. Screening for diabetes mellitus  - Hemoglobin A1c  8. Encounter for surveillance of contraceptive pills   -USPSTF grade A and B recommendations reviewed with patient; age-appropriate recommendations, preventive care, screening tests, etc discussed and encouraged; healthy living encouraged; see AVS for patient education given to patient -Discussed importance of 150 minutes of physical activity weekly, eat two servings of fish weekly, eat one serving of tree nuts ( cashews, pistachios, pecans, almonds.Marland Kitchen) every other day, eat 6 servings of fruit/vegetables daily and drink plenty of water and avoid sweet beverages.

## 2019-08-28 LAB — CBC WITH DIFFERENTIAL/PLATELET
Absolute Monocytes: 286 cells/uL (ref 200–950)
Basophils Absolute: 11 cells/uL (ref 0–200)
Basophils Relative: 0.2 %
Eosinophils Absolute: 80 cells/uL (ref 15–500)
Eosinophils Relative: 1.5 %
HCT: 39.3 % (ref 35.0–45.0)
Hemoglobin: 13.2 g/dL (ref 11.7–15.5)
Lymphs Abs: 1807 cells/uL (ref 850–3900)
MCH: 31 pg (ref 27.0–33.0)
MCHC: 33.6 g/dL (ref 32.0–36.0)
MCV: 92.3 fL (ref 80.0–100.0)
MPV: 10.7 fL (ref 7.5–12.5)
Monocytes Relative: 5.4 %
Neutro Abs: 3116 cells/uL (ref 1500–7800)
Neutrophils Relative %: 58.8 %
Platelets: 291 10*3/uL (ref 140–400)
RBC: 4.26 10*6/uL (ref 3.80–5.10)
RDW: 12.6 % (ref 11.0–15.0)
Total Lymphocyte: 34.1 %
WBC: 5.3 10*3/uL (ref 3.8–10.8)

## 2019-08-28 LAB — LIPID PANEL
Cholesterol: 187 mg/dL (ref <200)
HDL: 59 mg/dL (ref >=50)
LDL Cholesterol (Calc): 112 mg/dL (calc) — ABNORMAL HIGH
Non-HDL Cholesterol (Calc): 128 mg/dL (calc) (ref <130)
Total CHOL/HDL Ratio: 3.2 (calc) (ref <5.0)
Triglycerides: 70 mg/dL (ref <150)

## 2019-08-28 LAB — COMPLETE METABOLIC PANEL WITH GFR
AG Ratio: 1.5 (calc) (ref 1.0–2.5)
ALT: 18 U/L (ref 6–29)
AST: 17 U/L (ref 10–30)
Albumin: 4.4 g/dL (ref 3.6–5.1)
Alkaline phosphatase (APISO): 37 U/L (ref 31–125)
BUN: 9 mg/dL (ref 7–25)
CO2: 22 mmol/L (ref 20–32)
Calcium: 9.6 mg/dL (ref 8.6–10.2)
Chloride: 104 mmol/L (ref 98–110)
Creat: 0.93 mg/dL (ref 0.50–1.10)
GFR, Est African American: 100 mL/min/{1.73_m2} (ref >=60)
GFR, Est Non African American: 86 mL/min/{1.73_m2} (ref >=60)
Globulin: 3 g/dL (calc) (ref 1.9–3.7)
Glucose, Bld: 83 mg/dL (ref 65–99)
Potassium: 3.9 mmol/L (ref 3.5–5.3)
Sodium: 137 mmol/L (ref 135–146)
Total Bilirubin: 1.2 mg/dL (ref 0.2–1.2)
Total Protein: 7.4 g/dL (ref 6.1–8.1)

## 2019-08-28 LAB — HEMOGLOBIN A1C
Hgb A1c MFr Bld: 5.1 % of total Hgb (ref <5.7)
Mean Plasma Glucose: 100 (calc)
eAG (mmol/L): 5.5 (calc)

## 2019-08-31 LAB — CYTOLOGY - PAP
Chlamydia: NEGATIVE
Diagnosis: NEGATIVE
Neisseria Gonorrhea: NEGATIVE

## 2019-12-22 ENCOUNTER — Telehealth: Payer: Self-pay | Admitting: Family Medicine

## 2019-12-22 NOTE — Telephone Encounter (Signed)
Copied from [place] [id number]. Topic: Quick Communication - Rx Refill/Question >> Dec 22, 2019 11:48 AM Rainey Pines A wrote: Medication: levonorgestrel-ethinyl estradiol (SEASONALE,INTROVALE,JOLESSA) 0.15-0.03 MG tablet(Patients medication has been held up at post office for over a week. Patient would like like a small supply sent to local pharmacy until mail order comes in. Patient would like a callback once medication has been sent to pharmacy.)  Has the patient contacted their pharmacy? Yes (Agent: If no, request that the patient contact the pharmacy for the refill.) (Agent: If yes, when and what did the pharmacy advise?)Contact PCP  Preferred Pharmacy (with phone number or street name): [id number] - [place], [place] [address] AT [address]  Phone:  [phone] Fax:  [phone]     Agent: Please be advised that RX refills may take up to 3 business days. We ask that you follow-up with your pharmacy.

## 2019-12-23 ENCOUNTER — Other Ambulatory Visit: Payer: Self-pay | Admitting: Family Medicine

## 2019-12-23 MED ORDER — LEVONORGEST-ETH ESTRAD 91-DAY 0.15-0.03 MG PO TABS: 1.0000 | ORAL_TABLET | Freq: Every day | ORAL | 0 refills | Status: DC

## 2020-01-07 DIAGNOSIS — Z111 Encounter for screening for respiratory tuberculosis: Secondary | ICD-10-CM | POA: Diagnosis not present

## 2020-01-09 DIAGNOSIS — Z111 Encounter for screening for respiratory tuberculosis: Secondary | ICD-10-CM | POA: Diagnosis not present

## 2020-01-19 DIAGNOSIS — Z111 Encounter for screening for respiratory tuberculosis: Secondary | ICD-10-CM | POA: Diagnosis not present

## 2020-01-22 DIAGNOSIS — Z111 Encounter for screening for respiratory tuberculosis: Secondary | ICD-10-CM | POA: Diagnosis not present

## 2020-02-16 DIAGNOSIS — Z23 Encounter for immunization: Secondary | ICD-10-CM | POA: Diagnosis not present

## 2020-03-08 DIAGNOSIS — Z23 Encounter for immunization: Secondary | ICD-10-CM | POA: Diagnosis not present

## 2020-03-29 ENCOUNTER — Other Ambulatory Visit: Payer: Self-pay | Admitting: Family Medicine

## 2020-03-29 MED ORDER — LEVONORGEST-ETH ESTRAD 91-DAY 0.15-0.03 MG PO TABS: 1.0000 | ORAL_TABLET | Freq: Every day | ORAL | 0 refills | Status: DC

## 2020-03-29 NOTE — Telephone Encounter (Signed)
Copied from CRM [id number]. Topic: Quick Communication - Rx Refill/Question >> Mar 29, 2020  8:26 AM Jaquita Rector A wrote: Medication: levonorgestrel-ethinyl estradiol (SEASONALE) 0.15-0.03 MG tablet   Has the patient contacted their pharmacy? Yes.   (Agent: If no, request that the patient contact the pharmacy for the refill.) (Agent: If yes, when and what did the pharmacy advise?)  Preferred Pharmacy (with phone number or street name): [facility] #[id number], [place] - [address] AT [facility] [address] [facility] [address]  Phone:  [phone] Fax:  [phone]     Agent: Please be advised that RX refills may take up to 3 business days. We ask that you follow-up with your pharmacy.

## 2020-04-11 ENCOUNTER — Ambulatory Visit (INDEPENDENT_AMBULATORY_CARE_PROVIDER_SITE_OTHER): Payer: BC Managed Care – PPO | Admitting: Family Medicine

## 2020-04-11 ENCOUNTER — Other Ambulatory Visit: Payer: Self-pay

## 2020-04-11 ENCOUNTER — Encounter: Payer: Self-pay | Admitting: Family Medicine

## 2020-04-11 VITALS — BP 130/74 | HR 96 | Temp 97.7°F | Resp 16 | Ht 65.0 in | Wt 122.8 lb

## 2020-04-11 DIAGNOSIS — R634 Abnormal weight loss: Secondary | ICD-10-CM | POA: Diagnosis not present

## 2020-04-11 DIAGNOSIS — Z3041 Encounter for surveillance of contraceptive pills: Secondary | ICD-10-CM | POA: Diagnosis not present

## 2020-04-11 MED ORDER — LEVONORGEST-ETH ESTRAD 91-DAY 0.15-0.03 MG PO TABS: 1.0000 | ORAL_TABLET | Freq: Every day | ORAL | 1 refills | Status: DC

## 2020-04-11 NOTE — Progress Notes (Signed)
Name: Alison Nunez   MRN: [id number]    DOB: 08/27/1996   Date:04/11/2020       Progress Note  Subjective  Chief Complaint  Chief Complaint  Patient presents with  . Medication Refill    Refill [place]    HPI  Weight loss: over the past 6 weeks she has lost weight, her mother and friends started to noticed. She states she did not noticed it herself. She does not have a scale at home, she states it coincided with the time she went back to school ( from remote classes) she is skipping meals. Discussed packing snacks, not skipping meals   Primary dysmenorrhea: doing well on medication, no problems with any cramping during her cycle, able to go to work. No breakthrough bleeding.    Patient Active Problem List   Diagnosis Date Noted  . Excessive and frequent menstruation with irregular cycle 12/13/2016  . H/O iron deficiency anemia 12/13/2016  . Primary dysmenorrhea 12/13/2016  . Cervical polyp 12/13/2016  . Encounter for surveillance of contraceptive pills 11/10/2015  . Scoliosis 11/10/2015    Past Surgical History:  Procedure Laterality Date  . CLOSED REDUCTION NASAL FRACTURE N/A 04/09/2016   Procedure: CLOSED REDUCTION NASAL FRACTURE;  Surgeon: Geanie Logan, MD;  Location: [facility];  Service: ENT;  Laterality: N/A;  . history of removal of cyst     pilonidal cyst x's 2.  . WISDOM TOOTH EXTRACTION      Family History  Problem Relation Age of Onset  . Diabetes Maternal Grandmother   . Heart disease Paternal Grandmother     Social History   Tobacco Use  . Smoking status: Never Smoker  . Smokeless tobacco: Never Used  Substance Use Topics  . Alcohol use: Yes    Alcohol/week: 1.0 standard drinks    Types: 1 Glasses of wine per week    Comment: occasionally      Current Outpatient Medications:  .  levonorgestrel-ethinyl estradiol (SEASONALE) 0.15-0.03 MG tablet, Take 1 tablet by mouth daily., Disp: 1 Package, Rfl: 0 .  levonorgestrel-ethinyl estradiol  (SEASONALE,INTROVALE,JOLESSA) 0.15-0.03 MG tablet, TAKE 1 TABLET DAILY, Disp: 91 tablet, Rfl: 4  No Known Allergies  I personally reviewed active problem list, medication list, allergies, family history, social history with the patient/caregiver today.   ROS  Constitutional: Negative for fever positive for  weight change.  Respiratory: Negative for cough and shortness of breath.   Cardiovascular: Negative for chest pain or palpitations.  Gastrointestinal: Negative for abdominal pain, no bowel changes.  Musculoskeletal: Negative for gait problem or joint swelling.  Skin: Negative for rash.  Neurological: Negative for dizziness or headache.  No other specific complaints in a complete review of systems (except as listed in HPI above).  Objective  Vitals:   04/11/20 1459  BP: 130/74  Pulse: 96  Resp: 16  Temp: 97.7 F (36.5 C)  TempSrc: Temporal  SpO2: 99%  Weight: 122 lb 12.8 oz (55.7 kg)  Height: 5\' 5"  (1.651 m)    Body mass index is 20.43 kg/m.  Physical Exam  Constitutional: Patient appears well-developed and well-nourished.  No distress.  HEENT: head atraumatic, normocephalic, pupils equal and reactive to light Cardiovascular: Normal rate, regular rhythm and normal heart sounds.  No murmur heard. No BLE edema. Pulmonary/Chest: Effort normal and breath sounds normal. No respiratory distress. Abdominal: Soft.  There is no tenderness. Psychiatric: Patient has a normal mood and affect. behavior is normal. Judgment and thought content normal.  PHQ2/9: Depression screen  [facility] 2/9 04/11/2020 08/27/2019 06/16/2018 01/06/2018 11/10/2015  Decreased Interest 0 0 0 0 0  Down, Depressed, Hopeless 0 0 0 0 0  PHQ - 2 Score 0 0 0 0 0  Altered sleeping 0 0 0 - -  Tired, decreased energy 0 0 0 - -  Change in appetite 0 0 0 - -  Feeling bad or failure about yourself  0 0 0 - -  Trouble concentrating 0 0 0 - -  Moving slowly or fidgety/restless 0 0 0 - -  Suicidal thoughts 0 0 0 - -   PHQ-9 Score 0 0 0 - -  Difficult doing work/chores - Not difficult at all Not difficult at all - -    phq 9 is negative   Fall Risk: Fall Risk  04/11/2020 08/27/2019 06/16/2018 01/06/2018 11/10/2015  Falls in the past year? 0 0 No No No  Number falls in past yr: 0 0 - - -  Injury with Fall? 0 0 - - -     Functional Status Survey: Is the patient deaf or have difficulty hearing?: No Does the patient have difficulty seeing, even when wearing glasses/contacts?: No Does the patient have difficulty concentrating, remembering, or making decisions?: No Does the patient have difficulty walking or climbing stairs?: No Does the patient have difficulty dressing or bathing?: No Does the patient have difficulty doing errands alone such as visiting a doctor's office or shopping?: No    Assessment & Plan  1. Weight loss, unintentional  - CBC with Differential/Platelet - COMPLETE METABOLIC PANEL WITH GFR - Thyroid Panel With TSH - HIV Antibody (routine testing w rflx)  2. Encounter for surveillance of contraceptive pills  - levonorgestrel-ethinyl estradiol (SEASONALE) 0.15-0.03 MG tablet; Take 1 tablet by mouth daily.  Dispense: 91 tablet; Refill: 1

## 2020-04-12 LAB — CBC WITH DIFFERENTIAL/PLATELET
Absolute Monocytes: 381 cells/uL (ref 200–950)
Basophils Absolute: 20 cells/uL (ref 0–200)
Basophils Relative: 0.3 %
Eosinophils Absolute: 218 cells/uL (ref 15–500)
Eosinophils Relative: 3.2 %
HCT: 44.6 % (ref 35.0–45.0)
Hemoglobin: 14.8 g/dL (ref 11.7–15.5)
Lymphs Abs: 3080 cells/uL (ref 850–3900)
MCH: 30.4 pg (ref 27.0–33.0)
MCHC: 33.2 g/dL (ref 32.0–36.0)
MCV: 91.6 fL (ref 80.0–100.0)
MPV: 10.8 fL (ref 7.5–12.5)
Monocytes Relative: 5.6 %
Neutro Abs: 3101 cells/uL (ref 1500–7800)
Neutrophils Relative %: 45.6 %
Platelets: 285 10*3/uL (ref 140–400)
RBC: 4.87 10*6/uL (ref 3.80–5.10)
RDW: 12.3 % (ref 11.0–15.0)
Total Lymphocyte: 45.3 %
WBC: 6.8 10*3/uL (ref 3.8–10.8)

## 2020-04-12 LAB — COMPLETE METABOLIC PANEL WITH GFR
AG Ratio: 1.4 (calc) (ref 1.0–2.5)
ALT: 18 U/L (ref 6–29)
AST: 16 U/L (ref 10–30)
Albumin: 4.8 g/dL (ref 3.6–5.1)
Alkaline phosphatase (APISO): 37 U/L (ref 31–125)
BUN: 12 mg/dL (ref 7–25)
CO2: 24 mmol/L (ref 20–32)
Calcium: 10.3 mg/dL — ABNORMAL HIGH (ref 8.6–10.2)
Chloride: 104 mmol/L (ref 98–110)
Creat: 0.89 mg/dL (ref 0.50–1.10)
GFR, Est African American: 104 mL/min/{1.73_m2} (ref >=60)
GFR, Est Non African American: 90 mL/min/{1.73_m2} (ref >=60)
Globulin: 3.5 g/dL (calc) (ref 1.9–3.7)
Glucose, Bld: 82 mg/dL (ref 65–99)
Potassium: 3.8 mmol/L (ref 3.5–5.3)
Sodium: 139 mmol/L (ref 135–146)
Total Bilirubin: 1.1 mg/dL (ref 0.2–1.2)
Total Protein: 8.3 g/dL — ABNORMAL HIGH (ref 6.1–8.1)

## 2020-04-12 LAB — THYROID PANEL WITH TSH
Free Thyroxine Index: 3 (ref 1.4–3.8)
T3 Uptake: 31 % (ref 22–35)
T4, Total: 9.7 ug/dL (ref 5.1–11.9)
TSH: 0.58 mIU/L

## 2020-04-12 LAB — HIV ANTIBODY (ROUTINE TESTING W REFLEX): HIV 1&2 Ab, 4th Generation: NONREACTIVE

## 2020-08-31 NOTE — Progress Notes (Signed)
Name: Alison Nunez   MRN: [id number]    DOB: [birth year]   Date:09/01/2020       Progress Note  Subjective  Chief Complaint  Chief Complaint  Patient presents with  . Annual Exam    HPI  Patient presents for annual CPE.  Diet: not very balanced, trying to gain weight, discussed resuming a healthy  Exercise:  Busy with rotations at school, she will resume 3 days a week     Office Visit from 09/01/2020 in [facility]  AUDIT-C Score 0     Depression: Phq 9 is  negative Depression screen [facility] 2/9 09/01/2020 04/11/2020 08/27/2019 06/16/2018 01/06/2018  Decreased Interest 0 0 0 0 0  Down, Depressed, Hopeless 0 0 0 0 0  PHQ - 2 Score 0 0 0 0 0  Altered sleeping - 0 0 0 -  Tired, decreased energy - 0 0 0 -  Change in appetite - 0 0 0 -  Feeling bad or failure about yourself  - 0 0 0 -  Trouble concentrating - 0 0 0 -  Moving slowly or fidgety/restless - 0 0 0 -  Suicidal thoughts - 0 0 0 -  PHQ-9 Score - 0 0 0 -  Difficult doing work/chores - - Not difficult at all Not difficult at all -   Hypertension: BP Readings from Last 3 Encounters:  09/01/20 110/64  04/11/20 130/74  08/27/19 116/72   Obesity: Wt Readings from Last 3 Encounters:  09/01/20 139 lb 14.4 oz (63.5 kg)  04/11/20 122 lb 12.8 oz (55.7 kg)  08/27/19 140 lb 14.4 oz (63.9 kg)   BMI Readings from Last 3 Encounters:  09/01/20 23.64 kg/m  04/11/20 20.43 kg/m  08/27/19 23.45 kg/m     Vaccines:  HPV: up to date Shingrix: n/a Pneumonia: n/a Flu: educated and discussed with patient.  Hep C Screening: [id number] STD testing and prevention (HIV/chl/gon/syphilis): not interested  Intimate partner violence: negative screen Sexual History : no pain or discharge  Menstrual History/LMP/Abnormal Bleeding: cycles are regular every 3 months and takes pills like prescribed and using condoms,  Incontinence Symptoms: No problems  Breast cancer:  - Last Mammogram: n/a - BRCA gene screening:  n/a  Osteoporosis: Discussed high calcium and vitamin D supplementation, weight bearing exercises  Cervical cancer screening: wants to skip today   Skin cancer: Discussed monitoring for atypical lesions  Colorectal cancer: n/a Lung cancer:  Low Dose CT Chest recommended if Age 66-80 years, 30 pack-year currently smoking OR have quit w/in 15years. Patient does not qualify.   ECG: n/a  Advanced Care Planning: A voluntary discussion about advance care planning including the explanation and discussion of advance directives.  Discussed health care proxy and Living will, and the patient was able to identify a health care proxy as mother .  Patient does not have a living will at present time   Lipids: Lab Results  Component Value Date   CHOL 187 08/27/2019   CHOL 225 (H) 01/06/2018   CHOL 199 12/13/2016   Lab Results  Component Value Date   HDL 59 08/27/2019   HDL 70 01/06/2018   HDL 69 12/13/2016   Lab Results  Component Value Date   LDLCALC 112 (H) 08/27/2019   LDLCALC 129 (H) 01/06/2018   LDLCALC 115 (H) 12/13/2016   Lab Results  Component Value Date   TRIG 70 08/27/2019   TRIG 148 01/06/2018   TRIG 77 12/13/2016   Lab Results  Component Value  Date   CHOLHDL 3.2 08/27/2019   CHOLHDL 3.2 01/06/2018   CHOLHDL 2.9 12/13/2016   No results found for: LDLDIRECT  Glucose: Glucose, Bld  Date Value Ref Range Status  04/11/2020 82 65 - 99 mg/dL Final    Comment:    .            Fasting reference interval .   08/27/2019 83 65 - 99 mg/dL Final    Comment:    .            Fasting reference interval .   01/06/2018 80 65 - 99 mg/dL Final    Comment:    .            Fasting reference interval .     Patient Active Problem List   Diagnosis Date Noted  . Excessive and frequent menstruation with irregular cycle 12/13/2016  . H/O iron deficiency anemia 12/13/2016  . Primary dysmenorrhea 12/13/2016  . Cervical polyp 12/13/2016  . Encounter for surveillance of  contraceptive pills 11/10/2015  . Scoliosis 11/10/2015    Past Surgical History:  Procedure Laterality Date  . CLOSED REDUCTION NASAL FRACTURE N/A 04/09/2016   Procedure: CLOSED REDUCTION NASAL FRACTURE;  Surgeon: Clyde Canterbury, MD;  Location: [facility];  Service: ENT;  Laterality: N/A;  . history of removal of cyst     pilonidal cyst x's 2.  . WISDOM TOOTH EXTRACTION      Family History  Problem Relation Age of Onset  . Diabetes Maternal Grandmother   . Heart disease Paternal Grandmother     Social History   Socioeconomic History  . Marital status: Single    Spouse name: Not on file  . Number of children: 0  . Years of education: Not on file  . Highest education level: Bachelor's degree (e.g., BA, AB, BS)  Occupational History  . Occupation: rehab techinician     Comment: [employer]  Tobacco Use  . Smoking status: Never Smoker  . Smokeless tobacco: Never Used  Vaping Use  . Vaping Use: Never used  Substance and Sexual Activity  . Alcohol use: Yes    Alcohol/week: 1.0 standard drink    Types: 1 Glasses of wine per week    Comment: occasionally   . Drug use: No  . Sexual activity: Yes    Partners: Male    Birth control/protection: Pill, Condom  Other Topics Concern  . Not on file  Social History Narrative   Graduated  from [employer] May 2018   [employer] OT master degree completing Dec 2021 Graduating from [employer] program in Dec 2021   Dating since Nov 2018. 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    Social Determinants of Health   Financial Resource Strain: Low Risk   . Difficulty of Paying Living Expenses: Not hard at all  Food Insecurity: No Food Insecurity  . Worried About Charity fundraiser in the Last Year: Never true  . Ran Out of Food in the Last Year: Never true  Transportation Needs: No Transportation Needs  . Lack of Transportation (Medical): No  .  Lack of Transportation (Non-Medical): No  Physical Activity: Insufficiently Active  . Days of Exercise per Week: 2 days  . Minutes of Exercise per Session: 40 min  Stress: No Stress Concern Present  . Feeling of Stress : Not at all  Social Connections: Moderately Integrated  .  Frequency of Communication with Friends and Family: More than three times a week  . Frequency of Social Gatherings with Friends and Family: Three times a week  . Attends Religious Services: More than 4 times per year  . Active Member of Clubs or Organizations: Yes  . Attends Archivist Meetings: Never  . Marital Status: Never married  Intimate Partner Violence: Not At Risk  . Fear of Current or Ex-Partner: No  . Emotionally Abused: No  . Physically Abused: No  . Sexually Abused: No     Current Outpatient Medications:  .  levonorgestrel-ethinyl estradiol (SEASONALE) 0.15-0.03 MG tablet, Take 1 tablet by mouth daily., Disp: 91 tablet, Rfl: 3  No Known Allergies   ROS  Constitutional: Negative for fever , positive for weight change.  Respiratory: Negative for cough and shortness of breath.   Cardiovascular: Negative for chest pain or palpitations.  Gastrointestinal: Negative for abdominal pain, no bowel changes.  Musculoskeletal: Negative for gait problem or joint swelling.  Skin: Negative for rash.  Neurological: Negative for dizziness or headache.  No other specific complaints in a complete review of systems (except as listed in HPI above).  Objective  Vitals:   09/01/20 1016  BP: 110/64  Pulse: 97  Resp: 16  Temp: 98.7 F (37.1 C)  TempSrc: Oral  SpO2: 100%  Weight: 139 lb 14.4 oz (63.5 kg)  Height: 5' 4.5" (1.638 m)    Body mass index is 23.64 kg/m.  Physical Exam  Constitutional: Patient appears well-developed and well-nourished. No distress.  HENT: Head: Normocephalic and atraumatic. Ears: B TMs ok, no erythema or effusion; Nose: not done Mouth/Throat: not done Eyes:  Conjunctivae and EOM are normal. Pupils are equal, round, and reactive to light. No scleral icterus.  Neck: Normal range of motion. Neck supple. No JVD present. No thyromegaly present.  Cardiovascular: Normal rate, regular rhythm and normal heart sounds.  No murmur heard. No BLE edema. Pulmonary/Chest: Effort normal and breath sounds normal. No respiratory distress. Abdominal: Soft. Bowel sounds are normal, no distension. There is no tenderness. no masses Breast: no lumps or masses, no nipple discharge or rashes FEMALE GENITALIA:  External genitalia normal External urethra normal Vaginal vault normal without discharge or lesions Cervix normal without discharge or lesions Bimanual exam normal without masses RECTAL: not done  Musculoskeletal: Normal range of motion, no joint effusions. No gross deformities Neurological: he is alert and oriented to person, place, and time. No cranial nerve deficit. Coordination, balance, strength, speech and gait are normal.  Skin: Skin is warm and dry. No rash noted. No erythema.  Psychiatric: Patient has a normal mood and affect. behavior is normal. Judgment and thought content normal.  Fall Risk: Fall Risk  09/01/2020 04/11/2020 08/27/2019 06/16/2018 01/06/2018  Falls in the past year? 0 0 0 No No  Number falls in past yr: 0 0 0 - -  Injury with Fall? 0 0 0 - -     Functional Status Survey: Is the patient deaf or have difficulty hearing?: No Does the patient have difficulty seeing, even when wearing glasses/contacts?: No Does the patient have difficulty concentrating, remembering, or making decisions?: No Does the patient have difficulty walking or climbing stairs?: No Does the patient have difficulty dressing or bathing?: No Does the patient have difficulty doing errands alone such as visiting a doctor's office or shopping?: No   Assessment & Plan  1. Well adult exam   2. Cervical cancer screening  She would like to skip, same  partner for years    3. Encounter for surveillance of contraceptive pills  - levonorgestrel-ethinyl estradiol (SEASONALE) 0.15-0.03 MG tablet; Take 1 tablet by mouth daily.  Dispense: 91 tablet; Refill: 3  4. Need for immunization against influenza  - Flu Vaccine QUAD 36+ mos IM -USPSTF grade A and B recommendations reviewed with patient; age-appropriate recommendations, preventive care, screening tests, etc discussed and encouraged; healthy living encouraged; see AVS for patient education given to patient -Discussed importance of 150 minutes of physical activity weekly, eat two servings of fish weekly, eat one serving of tree nuts ( cashews, pistachios, pecans, almonds.Marland Kitchen) every other day, eat 6 servings of fruit/vegetables daily and drink plenty of water and avoid sweet beverages.

## 2020-09-01 ENCOUNTER — Ambulatory Visit (INDEPENDENT_AMBULATORY_CARE_PROVIDER_SITE_OTHER): Payer: BC Managed Care – PPO | Admitting: Family Medicine

## 2020-09-01 ENCOUNTER — Encounter: Payer: Self-pay | Admitting: Family Medicine

## 2020-09-01 ENCOUNTER — Other Ambulatory Visit: Payer: Self-pay

## 2020-09-01 VITALS — BP 110/64 | HR 97 | Temp 98.7°F | Resp 16 | Ht 64.5 in | Wt 139.9 lb

## 2020-09-01 DIAGNOSIS — Z3041 Encounter for surveillance of contraceptive pills: Secondary | ICD-10-CM

## 2020-09-01 DIAGNOSIS — Z0000 Encounter for general adult medical examination without abnormal findings: Secondary | ICD-10-CM

## 2020-09-01 DIAGNOSIS — Z23 Encounter for immunization: Secondary | ICD-10-CM | POA: Diagnosis not present

## 2020-09-01 DIAGNOSIS — Z124 Encounter for screening for malignant neoplasm of cervix: Secondary | ICD-10-CM | POA: Diagnosis not present

## 2020-09-01 MED ORDER — LEVONORGEST-ETH ESTRAD 91-DAY 0.15-0.03 MG PO TABS: 1.0000 | ORAL_TABLET | Freq: Every day | ORAL | 3 refills | Status: DC

## 2020-09-01 NOTE — Patient Instructions (Signed)
Preventive Care 20-25 Years Old, Female Preventive care refers to visits with your health care provider and lifestyle choices that can promote health and wellness. This includes:  A yearly physical exam. This may also be called an annual well check.  Regular dental visits and eye exams.  Immunizations.  Screening for certain conditions.  Healthy lifestyle choices, such as eating a healthy diet, getting regular exercise, not using drugs or products that contain nicotine and tobacco, and limiting alcohol use. What can I expect for my preventive care visit? Physical exam Your health care provider will check your:  Height and weight. This may be used to calculate body mass index (BMI), which tells if you are at a healthy weight.  Heart rate and blood pressure.  Skin for abnormal spots. Counseling Your health care provider may ask you questions about your:  Alcohol, tobacco, and drug use.  Emotional well-being.  Home and relationship well-being.  Sexual activity.  Eating habits.  Work and work Statistician.  Method of birth control.  Menstrual cycle.  Pregnancy history. What immunizations do I need?  Influenza (flu) vaccine  This is recommended every year. Tetanus, diphtheria, and pertussis (Tdap) vaccine  You may need a Td booster every 10 years. Varicella (chickenpox) vaccine  You may need this if you have not been vaccinated. Human papillomavirus (HPV) vaccine  If recommended by your health care provider, you may need three doses over 6 months. Measles, mumps, and rubella (MMR) vaccine  You may need at least one dose of MMR. You may also need a second dose. Meningococcal conjugate (MenACWY) vaccine  One dose is recommended if you are age 75-21 years and a first-year college student living in a residence hall, or if you have one of several medical conditions. You may also need additional booster doses. Pneumococcal conjugate (PCV13) vaccine  You may need  this if you have certain conditions and were not previously vaccinated. Pneumococcal polysaccharide (PPSV23) vaccine  You may need one or two doses if you smoke cigarettes or if you have certain conditions. Hepatitis A vaccine  You may need this if you have certain conditions or if you travel or work in places where you may be exposed to hepatitis A. Hepatitis B vaccine  You may need this if you have certain conditions or if you travel or work in places where you may be exposed to hepatitis B. Haemophilus influenzae type b (Hib) vaccine  You may need this if you have certain conditions. You may receive vaccines as individual doses or as more than one vaccine together in one shot (combination vaccines). Talk with your health care provider about the risks and benefits of combination vaccines. What tests do I need?  Blood tests  Lipid and cholesterol levels. These may be checked every 5 years starting at age 33.  Hepatitis C test.  Hepatitis B test. Screening  Diabetes screening. This is done by checking your blood sugar (glucose) after you have not eaten for a while (fasting).  Sexually transmitted disease (STD) testing.  BRCA-related cancer screening. This may be done if you have a family history of breast, ovarian, tubal, or peritoneal cancers.  Pelvic exam and Pap test. This may be done every 3 years starting at age 76. Starting at age 102, this may be done every 5 years if you have a Pap test in combination with an HPV test. Talk with your health care provider about your test results, treatment options, and if necessary, the need for more tests.  Follow these instructions at home: Eating and drinking   Eat a diet that includes fresh fruits and vegetables, whole grains, lean protein, and low-fat dairy.  Take vitamin and mineral supplements as recommended by your health care provider.  Do not drink alcohol if: ? Your health care provider tells you not to drink. ? You are  pregnant, may be pregnant, or are planning to become pregnant.  If you drink alcohol: ? Limit how much you have to 0-1 drink a day. ? Be aware of how much alcohol is in your drink. In the [place] one drink equals one 12 oz bottle of beer (355 mL), one 5 oz glass of wine (148 mL), or one 1 oz glass of hard liquor (44 mL). Lifestyle  Take daily care of your teeth and gums.  Stay active. Exercise for at least 30 minutes on 5 or more days each week.  Do not use any products that contain nicotine or tobacco, such as cigarettes, e-cigarettes, and chewing tobacco. If you need help quitting, ask your health care provider.  If you are sexually active, practice safe sex. Use a condom or other form of birth control (contraception) in order to prevent pregnancy and STIs (sexually transmitted infections). If you plan to become pregnant, see your health care provider for a preconception visit. What's next?  Visit your health care provider once a year for a well check visit.  Ask your health care provider how often you should have your eyes and teeth checked.  Stay up to date on all vaccines. This information is not intended to replace advice given to you by your health care provider. Make sure you discuss any questions you have with your health care provider. Document Revised: 08/20/2018 Document Reviewed: 08/20/2018 Elsevier Patient Education  2020 [employer].

## 2021-04-04 ENCOUNTER — Other Ambulatory Visit: Payer: Self-pay | Admitting: Family Medicine

## 2021-04-04 DIAGNOSIS — Z3041 Encounter for surveillance of contraceptive pills: Secondary | ICD-10-CM

## 2021-04-04 MED ORDER — LEVONORGEST-ETH ESTRAD 91-DAY 0.15-0.03 MG PO TABS: 1.0000 | ORAL_TABLET | Freq: Every day | ORAL | 1 refills | Status: DC

## 2021-04-04 NOTE — Telephone Encounter (Signed)
Copied from CRM [id number]. Topic: Quick Communication - Rx Refill/Question >> Apr 04, 2021  3:34 PM Marylen Ponto wrote: Medication: levonorgestrel-ethinyl estradiol (SEASONALE) 0.15-0.03 MG tablet  Has the patient contacted their pharmacy? No - Pt requests that the Rx be transferred  Preferred Pharmacy (with phone number or street name): [id number] [place], [place] - [address] AT [facility] [address]   Phone: [phone]   Fax: [phone]  Agent: Please be advised that RX refills may take up to 3 business days. We ask that you follow-up with your pharmacy.

## 2021-07-19 ENCOUNTER — Other Ambulatory Visit: Payer: Self-pay | Admitting: Family Medicine

## 2021-07-19 DIAGNOSIS — Z3041 Encounter for surveillance of contraceptive pills: Secondary | ICD-10-CM

## 2021-07-19 MED ORDER — LEVONORGEST-ETH ESTRAD 91-DAY 0.15-0.03 MG PO TABS: 1.0000 | ORAL_TABLET | Freq: Every day | ORAL | 1 refills | Status: DC

## 2021-07-19 NOTE — Telephone Encounter (Signed)
Medication Refill - Medication: levonorgestrel-ethinyl estradiol (SEASONALE) 0.15-0.03 MG tablet  Has the patient contacted their pharmacy? Yes.    (Agent: If yes, when and what did the pharmacy advise?) Contact PCP office and request medication  Preferred Pharmacy (with phone number or street name):   [id number] [place], [place] - [address] AT [facility] OF [address] Phone:  [phone]  Fax:  [phone]      Agent: Please be advised that RX refills may take up to 3 business days. We ask that you follow-up with your pharmacy.

## 2021-09-03 ENCOUNTER — Encounter: Payer: Self-pay | Admitting: Family Medicine

## 2021-09-03 ENCOUNTER — Other Ambulatory Visit: Payer: Self-pay

## 2021-09-03 ENCOUNTER — Ambulatory Visit (INDEPENDENT_AMBULATORY_CARE_PROVIDER_SITE_OTHER): Payer: BC Managed Care – PPO | Admitting: Family Medicine

## 2021-09-03 VITALS — BP 118/78 | HR 76 | Temp 98.3°F | Resp 16 | Ht 65.0 in | Wt 143.0 lb

## 2021-09-03 DIAGNOSIS — Z13220 Encounter for screening for lipoid disorders: Secondary | ICD-10-CM | POA: Diagnosis not present

## 2021-09-03 DIAGNOSIS — Z0000 Encounter for general adult medical examination without abnormal findings: Secondary | ICD-10-CM

## 2021-09-03 DIAGNOSIS — Z79899 Other long term (current) drug therapy: Secondary | ICD-10-CM

## 2021-09-03 DIAGNOSIS — Z130 Encounter for screening for diseases of the blood and blood-forming organs and certain disorders involving the immune mechanism: Secondary | ICD-10-CM

## 2021-09-03 DIAGNOSIS — Z124 Encounter for screening for malignant neoplasm of cervix: Secondary | ICD-10-CM | POA: Diagnosis not present

## 2021-09-03 DIAGNOSIS — Z113 Encounter for screening for infections with a predominantly sexual mode of transmission: Secondary | ICD-10-CM | POA: Diagnosis not present

## 2021-09-03 DIAGNOSIS — Z3041 Encounter for surveillance of contraceptive pills: Secondary | ICD-10-CM

## 2021-09-03 MED ORDER — LEVONORGEST-ETH ESTRAD 91-DAY 0.15-0.03 MG PO TABS: 1.0000 | ORAL_TABLET | Freq: Every day | ORAL | 3 refills | Status: DC

## 2021-09-03 NOTE — Progress Notes (Signed)
Name: Alison Nunez   MRN: [id number]    DOB: 1996-11-10   Date:09/03/2021       Progress Note  Subjective  Chief Complaint  Annual Exam  HPI  Patient presents for annual CPE.  Diet: eating fast food , discussed packing lunch  Exercise: just joined a gym, boyfriend is a Copy Office Visit from 09/03/2021 in [facility]  AUDIT-C Score 2      Depression: Phq 9 is  negative Depression screen [facility] 2/9 09/03/2021 09/01/2020 04/11/2020 08/27/2019 06/16/2018  Decreased Interest 0 0 0 0 0  Down, Depressed, Hopeless 0 0 0 0 0  PHQ - 2 Score 0 0 0 0 0  Altered sleeping - - 0 0 0  Tired, decreased energy - - 0 0 0  Change in appetite - - 0 0 0  Feeling bad or failure about yourself  - - 0 0 0  Trouble concentrating - - 0 0 0  Moving slowly or fidgety/restless - - 0 0 0  Suicidal thoughts - - 0 0 0  PHQ-9 Score - - 0 0 0  Difficult doing work/chores - - - Not difficult at all Not difficult at all   Hypertension: BP Readings from Last 3 Encounters:  09/03/21 118/78  09/01/20 110/64  04/11/20 130/74   Obesity: Wt Readings from Last 3 Encounters:  09/03/21 143 lb (64.9 kg)  09/01/20 139 lb 14.4 oz (63.5 kg)  04/11/20 122 lb 12.8 oz (55.7 kg)   BMI Readings from Last 3 Encounters:  09/03/21 23.80 kg/m  09/01/20 23.64 kg/m  04/11/20 20.43 kg/m     Vaccines:   Pneumonia: educated and discussed with patient. Flu: educated and discussed with patient.  Hep C Screening: 1/115/19 STD testing and prevention (HIV/chl/gon/syphilis): 05/11/20 Intimate partner violence: negative Sexual History :one partner , no pain during sex  Menstrual History/LMP/Abnormal Bleeding: cycles with ocp, not heavy. LMP 08/29/2021  Incontinence Symptoms: no problems   Breast cancer:  - Last Mammogram: N/A - BRCA gene screening: N/A  Osteoporosis: Discussed high calcium and vitamin D supplementation, weight bearing exercises  Cervical cancer screening:  today  Skin cancer: Discussed monitoring for atypical lesions    Advanced Care Planning: A voluntary discussion about advance care planning including the explanation and discussion of advance directives.  Discussed health care proxy and Living will, and the patient was able to identify a health care proxy as   Lipids: Lab Results  Component Value Date   CHOL 187 08/27/2019   CHOL 225 (H) 01/06/2018   CHOL 199 12/13/2016   Lab Results  Component Value Date   HDL 59 08/27/2019   HDL 70 01/06/2018   HDL 69 12/13/2016   Lab Results  Component Value Date   LDLCALC 112 (H) 08/27/2019   LDLCALC 129 (H) 01/06/2018   LDLCALC 115 (H) 12/13/2016   Lab Results  Component Value Date   TRIG 70 08/27/2019   TRIG 148 01/06/2018   TRIG 77 12/13/2016   Lab Results  Component Value Date   CHOLHDL 3.2 08/27/2019   CHOLHDL 3.2 01/06/2018   CHOLHDL 2.9 12/13/2016   No results found for: LDLDIRECT  Glucose: Glucose, Bld  Date Value Ref Range Status  04/11/2020 82 65 - 99 mg/dL Final    Comment:    .            Fasting reference interval .   08/27/2019 83 65 - 99 mg/dL Final  Comment:    .            Fasting reference interval .   01/06/2018 80 65 - 99 mg/dL Final    Comment:    .            Fasting reference interval .     Patient Active Problem List   Diagnosis Date Noted   Excessive and frequent menstruation with irregular cycle 12/13/2016   H/O iron deficiency anemia 12/13/2016   Primary dysmenorrhea 12/13/2016   Cervical polyp 12/13/2016   Encounter for surveillance of contraceptive pills 11/10/2015   Scoliosis 11/10/2015    Past Surgical History:  Procedure Laterality Date   CLOSED REDUCTION NASAL FRACTURE N/A 04/09/2016   Procedure: CLOSED REDUCTION NASAL FRACTURE;  Surgeon: Clyde Canterbury, MD;  Location: [facility];  Service: ENT;  Laterality: N/A;   history of removal of cyst     pilonidal cyst x's 2.   WISDOM TOOTH EXTRACTION      Family  History  Problem Relation Age of Onset   Diabetes Maternal Grandmother    Heart disease Paternal Grandmother     Social History   Socioeconomic History   Marital status: Single    Spouse name: Not on file   Number of children: 0   Years of education: Not on file   Highest education level: Bachelor's degree (e.g., BA, AB, BS)  Occupational History   Occupation: rehab techinician     Comment: [employer]  Tobacco Use   Smoking status: Never   Smokeless tobacco: Never  Vaping Use   Vaping Use: Never used  Substance and Sexual Activity   Alcohol use: Yes    Alcohol/week: 1.0 standard drink    Types: 1 Glasses of wine per week    Comment: occasionally    Drug use: No   Sexual activity: Yes    Partners: Male    Birth control/protection: Pill, Condom  Other Topics Concern   Not on file  Social History Narrative   Graduated  from [employer] May 2018   [employer] master degree completing Dec 2021 Graduating from [employer] program in Dec 2021   Dating since Nov 2018. 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    Social Determinants of Health   Financial Resource Strain: Low Risk    Difficulty of Paying Living Expenses: Not hard at all  Food Insecurity: No Food Insecurity   Worried About Charity fundraiser in the Last Year: Never true   [place] in the Last Year: Never true  Transportation Needs: No Transportation Needs   Lack of Transportation (Medical): No   Lack of Transportation (Non-Medical): No  Physical Activity: Insufficiently Active   Days of Exercise per Week: 3 days   Minutes of Exercise per Session: 40 min  Stress: No Stress Concern Present   Feeling of Stress : Not at all  Social Connections: Moderately Integrated   Frequency of Communication with Friends and Family: More than three times a week   Frequency of Social Gatherings with Friends and Family: Twice a week    Attends Religious Services: More than 4 times per year   Active Member of [employer] or Organizations: No   Attends Archivist Meetings: Never   Marital Status: Living with partner  Intimate Partner Violence: Not At Risk   Fear of Current or Ex-Partner: No  Emotionally Abused: No   Physically Abused: No   Sexually Abused: No     Current Outpatient Medications:    levonorgestrel-ethinyl estradiol (SEASONALE) 0.15-0.03 MG tablet, Take 1 tablet by mouth daily., Disp: 91 tablet, Rfl: 1  No Known Allergies   ROS  Constitutional: Negative for fever or weight change.  Respiratory: Negative for cough and shortness of breath.   Cardiovascular: Negative for chest pain or palpitations.  Gastrointestinal: Negative for abdominal pain, no bowel changes.  Musculoskeletal: Negative for gait problem or joint swelling.  Skin: Negative for rash.  Neurological: Negative for dizziness or headache.  No other specific complaints in a complete review of systems (except as listed in HPI above).   Objective  Vitals:   09/03/21 0956  BP: 118/78  Pulse: 76  Resp: 16  Temp: 98.3 F (36.8 C)  SpO2: 99%  Weight: 143 lb (64.9 kg)  Height: _0  (1.651 m)    Body mass index is 23.8 kg/m.  Physical Exam  Constitutional: Patient appears well-developed and well-nourished. No distress.  HENT: Head: Normocephalic and atraumatic. Ears: B TMs ok, no erythema or effusion; Nose: Nose normal. Mouth/Throat: Oropharynx is clear and moist. No oropharyngeal exudate.  Eyes: Conjunctivae and EOM are normal. Pupils are equal, round, and reactive to light. No scleral icterus.  Neck: Normal range of motion. Neck supple. No JVD present. No thyromegaly present.  Cardiovascular: Normal rate, regular rhythm and normal heart sounds.  No murmur heard. No BLE edema. Pulmonary/Chest: Effort normal and breath sounds normal. No respiratory distress. Abdominal: Soft. Bowel sounds are normal, no distension. There is no  tenderness. no masses Breast: no lumps or masses, no nipple discharge or rashes FEMALE GENITALIA:  External genitalia normal External urethra normal Vaginal vault normal without discharge or lesions Cervix normal without discharge or lesions Bimanual exam normal without masses RECTAL: not done  Musculoskeletal: Normal range of motion, no joint effusions. No gross deformities Neurological: he is alert and oriented to person, place, and time. No cranial nerve deficit. Coordination, balance, strength, speech and gait are normal.  Skin: Skin is warm and dry. No rash noted. No erythema.  Psychiatric: Patient has a normal mood and affect. behavior is normal. Judgment and thought content normal.   Fall Risk: Fall Risk  09/03/2021 09/01/2020 04/11/2020 08/27/2019 06/16/2018  Falls in the past year? 0 0 0 0 No  Number falls in past yr: 0 0 0 0 -  Injury with Fall? 0 0 0 0 -  Risk for fall due to : No Fall Risks - - - -  Follow up Falls prevention discussed - - - -     Functional Status Survey: Is the patient deaf or have difficulty hearing?: No Does the patient have difficulty seeing, even when wearing glasses/contacts?: No Does the patient have difficulty concentrating, remembering, or making decisions?: No Does the patient have difficulty walking or climbing stairs?: No Does the patient have difficulty dressing or bathing?: No Does the patient have difficulty doing errands alone such as visiting a doctor's office or shopping?: No   Assessment & Plan  1. Well adult exam  - Cytology - PAP - Lipid panel - COMPLETE METABOLIC PANEL WITH GFR - CBC with Differential/Platelet - HIV Antibody (routine testing w rflx) - RPR  2. Cervical cancer screening  - Cytology - PAP  3. Encounter for surveillance of contraceptive pills  Continue medication   4. Lipid screening  - Lipid panel  5. Routine screening for STI (sexually transmitted  infection)  - HIV Antibody (routine testing w  rflx) - RPR  6. Screening for deficiency anemia  - CBC with Differential/Platelet  7. Long-term use of high-risk medication  - COMPLETE METABOLIC PANEL WITH GFR    -USPSTF grade A and B recommendations reviewed with patient; age-appropriate recommendations, preventive care, screening tests, etc discussed and encouraged; healthy living encouraged; see AVS for patient education given to patient -Discussed importance of 150 minutes of physical activity weekly, eat two servings of fish weekly, eat one serving of tree nuts ( cashews, pistachios, pecans, almonds.Marland Kitchen) every other day, eat 6 servings of fruit/vegetables daily and drink plenty of water and avoid sweet beverages.

## 2021-09-03 NOTE — Patient Instructions (Signed)
Preventive Care 21-26 Years Old, Female Preventive care refers to lifestyle choices and visits with your health care provider that can promote health and wellness. This includes: A yearly physical exam. This is also called an annual wellness visit. Regular dental and eye exams. Immunizations. Screening for certain conditions. Healthy lifestyle choices, such as: Eating a healthy diet. Getting regular exercise. Not using drugs or products that contain nicotine and tobacco. Limiting alcohol use. What can I expect for my preventive care visit? Physical exam Your health care provider may check your: Height and weight. These may be used to calculate your BMI (body mass index). BMI is a measurement that tells if you are at a healthy weight. Heart rate and blood pressure. Body temperature. Skin for abnormal spots. Counseling Your health care provider may ask you questions about your: Past medical problems. Family's medical history. Alcohol, tobacco, and drug use. Emotional well-being. Home life and relationship well-being. Sexual activity. Diet, exercise, and sleep habits. Work and work environment. Access to firearms. Method of birth control. Menstrual cycle. Pregnancy history. What immunizations do I need? Vaccines are usually given at various ages, according to a schedule. Your health care provider will recommend vaccines for you based on your age, medical history, and lifestyle or other factors, such as travel or where you work. What tests do I need? Blood tests Lipid and cholesterol levels. These may be checked every 5 years starting at age 20. Hepatitis C test. Hepatitis B test. Screening Diabetes screening. This is done by checking your blood sugar (glucose) after you have not eaten for a while (fasting). STD (sexually transmitted disease) testing, if you are at risk. BRCA-related cancer screening. This may be done if you have a family history of breast, ovarian, tubal, or  peritoneal cancers. Pelvic exam and Pap test. This may be done every 3 years starting at age 21. Starting at age 30, this may be done every 5 years if you have a Pap test in combination with an HPV test. Talk with your health care provider about your test results, treatment options, and if necessary, the need for more tests. Follow these instructions at home: Eating and drinking  Eat a healthy diet that includes fresh fruits and vegetables, whole grains, lean protein, and low-fat dairy products. Take vitamin and mineral supplements as recommended by your health care provider. Do not drink alcohol if: Your health care provider tells you not to drink. You are pregnant, may be pregnant, or are planning to become pregnant. If you drink alcohol: Limit how much you have to 0-1 drink a day. Be aware of how much alcohol is in your drink. In the [place] one drink equals one 12 oz bottle of beer (355 mL), one 5 oz glass of wine (148 mL), or one 1 oz glass of hard liquor (44 mL). Lifestyle Take daily care of your teeth and gums. Brush your teeth every morning and night with fluoride toothpaste. Floss one time each day. Stay active. Exercise for at least 30 minutes 5 or more days each week. Do not use any products that contain nicotine or tobacco, such as cigarettes, e-cigarettes, and chewing tobacco. If you need help quitting, ask your health care provider. Do not use drugs. If you are sexually active, practice safe sex. Use a condom or other form of protection to prevent STIs (sexually transmitted infections). If you do not wish to become pregnant, use a form of birth control. If you plan to become pregnant, see your health care provider   for a prepregnancy visit. Find healthy ways to cope with stress, such as: Meditation, yoga, or listening to music. Journaling. Talking to a trusted person. Spending time with friends and family. Safety Always wear your seat belt while driving or riding in a  vehicle. Do not drive: If you have been drinking alcohol. Do not ride with someone who has been drinking. When you are tired or distracted. While texting. Wear a helmet and other protective equipment during sports activities. If you have firearms in your house, make sure you follow all gun safety procedures. Seek help if you have been physically or sexually abused. What's next? Go to your health care provider once a year for an annual wellness visit. Ask your health care provider how often you should have your eyes and teeth checked. Stay up to date on all vaccines. This information is not intended to replace advice given to you by your health care provider. Make sure you discuss any questions you have with your health care provider. Document Revised: 02/16/2021 Document Reviewed: 08/20/2018 Elsevier Patient Education  2022 [employer] Inc[employer]  

## 2021-09-04 LAB — COMPLETE METABOLIC PANEL WITH GFR
AG Ratio: 1.5 (calc) (ref 1.0–2.5)
ALT: 11 U/L (ref 6–29)
AST: 12 U/L (ref 10–30)
Albumin: 4.7 g/dL (ref 3.6–5.1)
Alkaline phosphatase (APISO): 35 U/L (ref 31–125)
BUN/Creatinine Ratio: 11 (calc) (ref 6–22)
BUN: 11 mg/dL (ref 7–25)
CO2: 28 mmol/L (ref 20–32)
Calcium: 10 mg/dL (ref 8.6–10.2)
Chloride: 102 mmol/L (ref 98–110)
Creat: 0.98 mg/dL — ABNORMAL HIGH (ref 0.50–0.96)
Globulin: 3.2 g/dL (calc) (ref 1.9–3.7)
Glucose, Bld: 76 mg/dL (ref 65–99)
Potassium: 4 mmol/L (ref 3.5–5.3)
Sodium: 138 mmol/L (ref 135–146)
Total Bilirubin: 1 mg/dL (ref 0.2–1.2)
Total Protein: 7.9 g/dL (ref 6.1–8.1)
eGFR: 82 mL/min/{1.73_m2} (ref >=60)

## 2021-09-04 LAB — CBC WITH DIFFERENTIAL/PLATELET
Absolute Monocytes: 312 cells/uL (ref 200–950)
Basophils Absolute: 19 cells/uL (ref 0–200)
Basophils Relative: 0.4 %
Eosinophils Absolute: 91 cells/uL (ref 15–500)
Eosinophils Relative: 1.9 %
HCT: 41.2 % (ref 35.0–45.0)
Hemoglobin: 13.6 g/dL (ref 11.7–15.5)
Lymphs Abs: 1949 cells/uL (ref 850–3900)
MCH: 30.2 pg (ref 27.0–33.0)
MCHC: 33 g/dL (ref 32.0–36.0)
MCV: 91.4 fL (ref 80.0–100.0)
MPV: 10.9 fL (ref 7.5–12.5)
Monocytes Relative: 6.5 %
Neutro Abs: 2429 cells/uL (ref 1500–7800)
Neutrophils Relative %: 50.6 %
Platelets: 278 10*3/uL (ref 140–400)
RBC: 4.51 10*6/uL (ref 3.80–5.10)
RDW: 12.6 % (ref 11.0–15.0)
Total Lymphocyte: 40.6 %
WBC: 4.8 10*3/uL (ref 3.8–10.8)

## 2021-09-04 LAB — LIPID PANEL
Cholesterol: 192 mg/dL (ref <200)
HDL: 80 mg/dL (ref >=50)
LDL Cholesterol (Calc): 97 mg/dL (calc)
Non-HDL Cholesterol (Calc): 112 mg/dL (calc) (ref <130)
Total CHOL/HDL Ratio: 2.4 (calc) (ref <5.0)
Triglycerides: 66 mg/dL (ref <150)

## 2021-09-04 LAB — RPR: RPR Ser Ql: NONREACTIVE

## 2021-09-04 LAB — HIV ANTIBODY (ROUTINE TESTING W REFLEX): HIV 1&2 Ab, 4th Generation: NONREACTIVE

## 2021-09-10 LAB — CYTOLOGY - PAP
Chlamydia: NEGATIVE
Comment: NEGATIVE
Comment: NORMAL
Diagnosis: NEGATIVE
Diagnosis: REACTIVE
Neisseria Gonorrhea: NEGATIVE

## 2022-09-03 NOTE — Patient Instructions (Signed)

## 2022-09-03 NOTE — Progress Notes (Signed)
Name: Alison Nunez   MRN: [id number]    DOB: April 03, 1996   Date:09/04/2022       Progress Note  Subjective  Chief Complaint  Annual Exam  HPI  Patient presents for annual CPE.  Diet: cooking more at home but still eats out 3-4 times a week  Exercise: she walks 12000 steps per week   Last Eye Exam: she is due for an exam Last Dental Exam: regular exam  [place] Office Visit from 09/03/2021 in [facility]  AUDIT-C Score 2      Depression: Phq 9 is  negative    09/04/2022    3:34 PM 09/03/2021    9:56 AM 09/01/2020   10:11 AM 04/11/2020    3:01 PM 08/27/2019    9:57 AM  Depression screen PHQ 2/9  Decreased Interest 0 0 0 0 0  Down, Depressed, Hopeless 0 0 0 0 0  PHQ - 2 Score 0 0 0 0 0  Altered sleeping 0   0 0  Tired, decreased energy 0   0 0  Change in appetite 0   0 0  Feeling bad or failure about yourself  0   0 0  Trouble concentrating 0   0 0  Moving slowly or fidgety/restless 0   0 0  Suicidal thoughts 0   0 0  PHQ-9 Score 0   0 0  Difficult doing work/chores     Not difficult at all   Hypertension: BP Readings from Last 3 Encounters:  09/04/22 118/72  09/03/21 118/78  09/01/20 110/64   Obesity: Wt Readings from Last 3 Encounters:  09/04/22 130 lb 14.4 oz (59.4 kg)  09/03/21 143 lb (64.9 kg)  09/01/20 139 lb 14.4 oz (63.5 kg)   BMI Readings from Last 3 Encounters:  09/04/22 21.62 kg/m  09/03/21 23.80 kg/m  09/01/20 23.64 kg/m     Vaccines:   HPV: up to date Tdap: today  Shingrix: N/A Pneumonia: N/A Flu: today  COVID-19: discussed booster    Hep C Screening: 01/06/18 STD testing and prevention (HIV/chl/gon/syphilis): 09/03/21 Intimate partner violence: negative screen  Sexual History : same sexual partner for the past 4 years , living together for the past 2 years  Menstrual History/LMP/Abnormal Bleeding: every 3 months, taking ocp's  Discussed importance of follow up if any post-menopausal bleeding: not applicable   Incontinence Symptoms: negative for symptoms   Breast cancer:  - Last Mammogram: N/A - BRCA gene screening: N/A   Osteoporosis Prevention : Discussed high calcium and vitamin D supplementation, weight bearing exercises  Cervical cancer screening: Ordered today  Skin cancer: Discussed monitoring for atypical lesions    Advanced Care Planning: A voluntary discussion about advance care planning including the explanation and discussion of advance directives.  Discussed health care proxy and Living will, and the patient was able to identify a health care proxy as mother - Odean Mcelwain .  Patient does not have a living will and power of attorney of health care   Lipids: Lab Results  Component Value Date   CHOL 192 09/03/2021   CHOL 187 08/27/2019   CHOL 225 (H) 01/06/2018   Lab Results  Component Value Date   HDL 80 09/03/2021   HDL 59 08/27/2019   HDL 70 01/06/2018   Lab Results  Component Value Date   LDLCALC 97 09/03/2021   LDLCALC 112 (H) 08/27/2019   LDLCALC 129 (H) 01/06/2018   Lab Results  Component Value Date  TRIG 66 09/03/2021   TRIG 70 08/27/2019   TRIG 148 01/06/2018   Lab Results  Component Value Date   CHOLHDL 2.4 09/03/2021   CHOLHDL 3.2 08/27/2019   CHOLHDL 3.2 01/06/2018   No results found for: "LDLDIRECT"  Glucose: Glucose, Bld  Date Value Ref Range Status  09/03/2021 76 65 - 99 mg/dL Final    Comment:    .            Fasting reference interval .   04/11/2020 82 65 - 99 mg/dL Final    Comment:    .            Fasting reference interval .   08/27/2019 83 65 - 99 mg/dL Final    Comment:    .            Fasting reference interval .     Patient Active Problem List   Diagnosis Date Noted   Excessive and frequent menstruation with irregular cycle 12/13/2016   H/O iron deficiency anemia 12/13/2016   Primary dysmenorrhea 12/13/2016   Cervical polyp 12/13/2016   Encounter for surveillance of contraceptive pills 11/10/2015   Scoliosis  11/10/2015    Past Surgical History:  Procedure Laterality Date   CLOSED REDUCTION NASAL FRACTURE N/A 04/09/2016   Procedure: CLOSED REDUCTION NASAL FRACTURE;  Surgeon: Clyde Canterbury, MD;  Location: [place];  Service: ENT;  Laterality: N/A;   history of removal of cyst     pilonidal cyst x's 2.   WISDOM TOOTH EXTRACTION      Family History  Problem Relation Age of Onset   Diabetes Maternal Grandmother    Heart disease Paternal Grandmother     Social History   Socioeconomic History   Marital status: Single    Spouse name: Not on file   Number of children: 0   Years of education: Not on file   Highest education level: Bachelor's degree (e.g., BA, AB, BS)  Occupational History   Occupation: rehab techinician     Comment: [employer]  Tobacco Use   Smoking status: Never   Smokeless tobacco: Never  Vaping Use   Vaping Use: Never used  Substance and Sexual Activity   Alcohol use: Yes    Alcohol/week: 1.0 standard drink of alcohol    Types: 1 Glasses of wine per week    Comment: occasionally    Drug use: No   Sexual activity: Yes    Partners: Male    Birth control/protection: Pill, Condom  Other Topics Concern   Not on file  Social History Narrative   Graduated  from [employer] May 2018   [employer] OT master degree completing Dec 2021 Graduated from [employer] program in Dec 2021   And 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    Social Determinants of Health   Financial Resource Strain: Low Risk  (09/04/2022)   Overall Financial Resource Strain (CARDIA)    Difficulty of Paying Living Expenses: Not hard at all  Food Insecurity: No Food Insecurity (09/04/2022)   Hunger Vital Sign    Worried About Running Out of Food in the Last Year: Never true    [place] in the Last Year: Never true  Transportation Needs: No Transportation Needs (09/04/2022)   PRAPARE - Radiographer, therapeutic (Medical): No    Lack of Transportation (Non-Medical): No  Physical Activity: Inactive (09/04/2022)   Exercise Vital Sign  Days of Exercise per Week: 0 days    Minutes of Exercise per Session: 0 min  Stress: No Stress Concern Present (09/04/2022)   [place]    Feeling of Stress : Not at all  Social Connections: Moderately Integrated (09/04/2022)   Social Connection and Isolation Panel [NHANES]    Frequency of Communication with Friends and Family: More than three times a week    Frequency of Social Gatherings with Friends and Family: More than three times a week    Attends Religious Services: 1 to 4 times per year    Active Member of [employer] or Organizations: No    Attends Archivist Meetings: Never    Marital Status: Living with partner  Intimate Partner Violence: Not At Risk (09/04/2022)   Humiliation, Afraid, Rape, and Kick questionnaire    Fear of Current or Ex-Partner: No    Emotionally Abused: No    Physically Abused: No    Sexually Abused: No     Current Outpatient Medications:    levonorgestrel-ethinyl estradiol (SEASONALE) 0.15-0.03 MG tablet, Take 1 tablet by mouth daily., Disp: 91 tablet, Rfl: 3  No Known Allergies   ROS  Constitutional: Negative for fever or weight change.  Respiratory: Negative for cough and shortness of breath.   Cardiovascular: Negative for chest pain or palpitations.  Gastrointestinal: Negative for abdominal pain, no bowel changes.  Musculoskeletal: Negative for gait problem or joint swelling.  Skin: Negative for rash.  Neurological: Negative for dizziness or headache.  No other specific complaints in a complete review of systems (except as listed in HPI above).   Objective  Vitals:   09/04/22 1526  BP: 118/72  Pulse: 83  Resp: 14  Temp: 98.3 F (36.8 C)  TempSrc: Oral  SpO2: 100%  Weight: 130 lb 14.4 oz (59.4 kg)  Height: 5' 5.25" (1.657 m)     Body mass index is 21.62 kg/m.  Physical Exam  Constitutional: Patient appears well-developed and well-nourished. No distress.  HENT: Head: Normocephalic and atraumatic. Ears: B TMs ok, no erythema or effusion; Nose: Nose normal. Mouth/Throat: Oropharynx is clear and moist. No oropharyngeal exudate.  Eyes: Conjunctivae and EOM are normal. Pupils are equal, round, and reactive to light. No scleral icterus.  Neck: Normal range of motion. Neck supple. No JVD present. No thyromegaly present.  Cardiovascular: Normal rate, regular rhythm and normal heart sounds.  No murmur heard. No BLE edema. Pulmonary/Chest: Effort normal and breath sounds normal. No respiratory distress. Abdominal: Soft. Bowel sounds are normal, no distension. There is no tenderness. no masses Breast: no lumps or masses, no nipple discharge or rashes FEMALE GENITALIA:  External genitalia normal External urethra normal Vaginal vault normal without discharge or lesions Cervix friable and polyp present  Bimanual exam normal without masses RECTAL: no rectal masses or hemorrhoids Musculoskeletal: Normal range of motion, no joint effusions. No gross deformities Neurological: he is alert and oriented to person, place, and time. No cranial nerve deficit. Coordination, balance, strength, speech and gait are normal.  Skin: Skin is warm and dry. No rash noted. No erythema.  Psychiatric: Patient has a normal mood and affect. behavior is normal. Judgment and thought content normal.   Fall Risk:    09/04/2022    3:28 PM 09/03/2021    9:55 AM 09/01/2020   10:11 AM 04/11/2020    3:01 PM 08/27/2019    9:58 AM  Fall Risk   Falls in the past year? 0 0  0 0 0  Number falls in past yr:  0 0 0 0  Injury with Fall?  0 0 0 0  Risk for fall due to : No Fall Risks No Fall Risks     Follow up Falls prevention discussed;Education provided Falls prevention discussed        Functional Status Survey: Is the patient deaf or have difficulty  hearing?: No Does the patient have difficulty seeing, even when wearing glasses/contacts?: No Does the patient have difficulty concentrating, remembering, or making decisions?: No Does the patient have difficulty walking or climbing stairs?: No Does the patient have difficulty dressing or bathing?: No Does the patient have difficulty doing errands alone such as visiting a doctor's office or shopping?: No   Assessment & Plan  1. Well adult exam  - Cytology - PAP  2. Need for Tdap vaccination  - Tdap vaccine greater than or equal to 7yo IM  3. Needs flu shot  - Flu Vaccine QUAD 6+ mos PF IM (Fluarix Quad PF)  4. Cervical cancer screening  Today   5. Weight loss  Weight  seems to go between 135 lbs to 145 lbs, she would like to hold off on checking labs at this time     6. Cervical polyp  - Ambulatory referral to Obstetrics / Gynecology   -USPSTF grade A and B recommendations reviewed with patient; age-appropriate recommendations, preventive care, screening tests, etc discussed and encouraged; healthy living encouraged; see AVS for patient education given to patient -Discussed importance of 150 minutes of physical activity weekly, eat two servings of fish weekly, eat one serving of tree nuts ( cashews, pistachios, pecans, almonds.Marland Kitchen) every other day, eat 6 servings of fruit/vegetables daily and drink plenty of water and avoid sweet beverages.   -Reviewed Health Maintenance: Yes.

## 2022-09-04 ENCOUNTER — Ambulatory Visit (INDEPENDENT_AMBULATORY_CARE_PROVIDER_SITE_OTHER): Payer: BLUE CROSS/BLUE SHIELD | Admitting: Family Medicine

## 2022-09-04 ENCOUNTER — Other Ambulatory Visit (HOSPITAL_COMMUNITY)
Admission: RE | Admit: 2022-09-04 | Discharge: 2022-09-04 | Disposition: A | Discharge status: Home / Self Care | Payer: BC Managed Care – PPO | Source: Ambulatory Visit | Attending: Family Medicine | Admitting: Family Medicine

## 2022-09-04 ENCOUNTER — Encounter: Payer: Self-pay | Admitting: Family Medicine

## 2022-09-04 VITALS — BP 118/72 | HR 83 | Temp 98.3°F | Resp 14 | Ht 65.25 in | Wt 130.9 lb

## 2022-09-04 DIAGNOSIS — N841 Polyp of cervix uteri: Secondary | ICD-10-CM

## 2022-09-04 DIAGNOSIS — Z124 Encounter for screening for malignant neoplasm of cervix: Secondary | ICD-10-CM | POA: Diagnosis not present

## 2022-09-04 DIAGNOSIS — Z23 Encounter for immunization: Secondary | ICD-10-CM | POA: Diagnosis not present

## 2022-09-04 DIAGNOSIS — R634 Abnormal weight loss: Secondary | ICD-10-CM | POA: Diagnosis not present

## 2022-09-04 DIAGNOSIS — Z0000 Encounter for general adult medical examination without abnormal findings: Secondary | ICD-10-CM

## 2022-09-12 LAB — CYTOLOGY - PAP: Diagnosis: NEGATIVE

## 2022-09-23 ENCOUNTER — Ambulatory Visit (INDEPENDENT_AMBULATORY_CARE_PROVIDER_SITE_OTHER): Payer: BLUE CROSS/BLUE SHIELD | Admitting: Obstetrics & Gynecology

## 2022-09-23 ENCOUNTER — Encounter: Payer: Self-pay | Admitting: Obstetrics & Gynecology

## 2022-09-23 VITALS — BP 123/86 | HR 76 | Resp 16 | Wt 129.3 lb

## 2022-09-23 DIAGNOSIS — Z01419 Encounter for gynecological examination (general) (routine) without abnormal findings: Secondary | ICD-10-CM | POA: Diagnosis not present

## 2022-09-23 DIAGNOSIS — N86 Erosion and ectropion of cervix uteri: Secondary | ICD-10-CM

## 2022-09-23 NOTE — Progress Notes (Signed)
Subjective:     Alison Nunez is a 26 y.o. female here for a routine exam.  Current complaints: patient is referred to our office from [facility], on 09/04/22 patient had well woman exam and it was noted that a cervical polyp was seen.  Personal health questionnaire reviewed: yes. Patient reports that she has had intermittent pelvic pain and irregular menstrual.    Gynecologic History Patient's last menstrual period was 09/09/2022. Contraception: OCP (estrogen/progesterone) Last Pap: 09/04/22. Results were: normal  Obstetric History OB History  Gravida Para Term Preterm AB Living  0 0 0 0 0 0  SAB IAB Ectopic Multiple Live Births  0 0 0 0 0     The following portions of the patient's history were reviewed and updated as appropriate: allergies, current medications, past family history, past medical history, past social history, past surgical history, and problem list.  Review of Systems Pertinent items noted in HPI and remainder of comprehensive ROS otherwise negative.    Objective:    BP 123/86   Pulse 76   Resp 16   Wt 129 lb 4.8 oz (58.7 kg)   LMP 09/09/2022   BMI 21.35 kg/m  General appearance: alert, cooperative, and no distress Abdomen: normal findings: no organomegaly and soft, non-tender Pelvic: cervix normal in appearance, external genitalia normal, no adnexal masses or tenderness, no cervical motion tenderness, uterus normal size, shape, and consistency, vagina normal without discharge, and wnl....no cervical polyps present pt has cervical ectropian Extremities: extremities normal, atraumatic, no cyanosis or edema Skin: Skin color, texture, turgor normal. No rashes or lesions Neurologic: Grossly normal    Assessment:    Healthy female exam.   Cervical ectropian or cervical erosion is normal Plan:    Follow up in: 1 year.    Rosario Adie, MD  09/23/2022 2:25 PM

## 2022-11-23 ENCOUNTER — Encounter: Payer: Self-pay | Admitting: Family Medicine

## 2022-11-23 ENCOUNTER — Other Ambulatory Visit: Payer: Self-pay | Admitting: Family Medicine

## 2022-11-23 DIAGNOSIS — Z3041 Encounter for surveillance of contraceptive pills: Secondary | ICD-10-CM

## 2022-11-25 ENCOUNTER — Other Ambulatory Visit: Payer: Self-pay

## 2022-11-25 DIAGNOSIS — Z3041 Encounter for surveillance of contraceptive pills: Secondary | ICD-10-CM

## 2022-11-25 MED ORDER — LEVONORGEST-ETH ESTRAD 91-DAY 0.15-0.03 MG PO TABS: 1.0000 | ORAL_TABLET | Freq: Every day | ORAL | 3 refills | Status: DC

## 2023-09-09 NOTE — Progress Notes (Unsigned)
Name: Alison Nunez   MRN: [id number]    DOB: 11/23/1996   Date:09/10/2023       Progress Note  Subjective  Chief Complaint  Annual Exam  HPI  Patient presents for annual CPE.  Diet: she eats whatever she wants - she is a picky eater - she likes fast food  Exercise:  continue regular activity  Last Eye Exam: up to date  Last Dental Exam: up to date   Flowsheet Row Office Visit from 09/10/2023 in [facility]  AUDIT-C Score 1      Depression: Phq 9 is  negative    09/10/2023    2:06 PM 09/04/2022    3:34 PM 09/03/2021    9:56 AM 09/01/2020   10:11 AM 04/11/2020    3:01 PM  Depression screen PHQ 2/9  Decreased Interest 0 0 0 0 0  Down, Depressed, Hopeless 0 0 0 0 0  PHQ - 2 Score 0 0 0 0 0  Altered sleeping 0 0   0  Tired, decreased energy 0 0   0  Change in appetite 0 0   0  Feeling bad or failure about yourself  0 0   0  Trouble concentrating 0 0   0  Moving slowly or fidgety/restless 0 0   0  Suicidal thoughts 0 0   0  PHQ-9 Score 0 0   0   Hypertension: BP Readings from Last 3 Encounters:  09/10/23 126/70  09/23/22 123/86  09/04/22 118/72   Obesity: Wt Readings from Last 3 Encounters:  09/10/23 135 lb (61.2 kg)  09/23/22 129 lb 4.8 oz (58.7 kg)  09/04/22 130 lb 14.4 oz (59.4 kg)   BMI Readings from Last 3 Encounters:  09/10/23 22.47 kg/m  09/23/22 21.35 kg/m  09/04/22 21.62 kg/m     Vaccines:   HPV: up to date Tdap: up to date Shingrix: N/A Pneumonia:  up to date Flu: she will get it today  COVID-19: up to date   Hep C Screening: 01/06/18 STD testing and prevention (HIV/chl/gon/syphilis): 09/03/21 Intimate partner violence: negative screen  Sexual History : uses condoms, same partner for many years Menstrual History/LMP/Abnormal Bleeding: cycles every 3 months, spots the week before her cycle, normal flow, not a lot of cramping  Discussed importance of follow up if any post-menopausal bleeding: not applicable   Incontinence Symptoms: negative for symptoms   Breast cancer:  - Last Mammogram: N/A - BRCA gene screening: N/A  Osteoporosis Prevention : Discussed high calcium and vitamin D supplementation, weight bearing exercises Bone density: N/A   Cervical cancer screening: 09/04/22  Skin cancer: Discussed monitoring for atypical lesions  Colorectal cancer: N/A   Lung cancer:  Low Dose CT Chest recommended if Age 16-80 years, 20 pack-year currently smoking OR have quit w/in 15years. Patient does not qualify for screen   ECG: N/A  Advanced Care Planning: A voluntary discussion about advance care planning including the explanation and discussion of advance directives.  Discussed health care proxy and Living will, and the patient was able to identify a health care proxy as mother - Matayah Petrov .  Patient does not have a living will and power of attorney of health care   Lipids: Lab Results  Component Value Date   CHOL 192 09/03/2021   CHOL 187 08/27/2019   CHOL 225 (H) 01/06/2018   Lab Results  Component Value Date   HDL 80 09/03/2021   HDL 59 08/27/2019   HDL  70 01/06/2018   Lab Results  Component Value Date   LDLCALC 97 09/03/2021   LDLCALC 112 (H) 08/27/2019   LDLCALC 129 (H) 01/06/2018   Lab Results  Component Value Date   TRIG 66 09/03/2021   TRIG 70 08/27/2019   TRIG 148 01/06/2018   Lab Results  Component Value Date   CHOLHDL 2.4 09/03/2021   CHOLHDL 3.2 08/27/2019   CHOLHDL 3.2 01/06/2018   No results found for: "LDLDIRECT"  Glucose: Glucose, Bld  Date Value Ref Range Status  09/03/2021 76 65 - 99 mg/dL Final    Comment:    .            Fasting reference interval .   04/11/2020 82 65 - 99 mg/dL Final    Comment:    .            Fasting reference interval .   08/27/2019 83 65 - 99 mg/dL Final    Comment:    .            Fasting reference interval .     Patient Active Problem List   Diagnosis Date Noted   Excessive and frequent menstruation with  irregular cycle 12/13/2016   H/O iron deficiency anemia 12/13/2016   Primary dysmenorrhea 12/13/2016   Cervical polyp 12/13/2016   Encounter for surveillance of contraceptive pills 11/10/2015   Scoliosis 11/10/2015    Past Surgical History:  Procedure Laterality Date   CLOSED REDUCTION NASAL FRACTURE N/A 04/09/2016   Procedure: CLOSED REDUCTION NASAL FRACTURE;  Surgeon: Geanie Logan, MD;  Location[facility];  Service: ENT;  Laterality: N/A;   history of removal of cyst     pilonidal cyst x's 2.   WISDOM TOOTH EXTRACTION      Family History  Problem Relation Age of Onset   Diabetes Maternal Grandmother    Heart disease Paternal Grandmother     Social History   Socioeconomic History   Marital status: Single    Spouse name: Not on file   Number of children: 0   Years of education: Not on file   Highest education level: Bachelor's degree (e.g., BA, AB, BS)  Occupational History   Occupation: rehab techinician     Comment: [employer]  Tobacco Use   Smoking status: Never   Smokeless tobacco: Never  Vaping Use   Vaping status: Never Used  Substance and Sexual Activity   Alcohol use: Yes    Alcohol/week: 1.0 standard drink of alcohol    Types: 1 Glasses of wine per week    Comment: occasionally    Drug use: No   Sexual activity: Yes    Partners: Male    Birth control/protection: Pill, Condom  Other Topics Concern   Not on file  Social History Narrative   Graduated  from [employer] May 2018   Exercise Science degree,with OT master degree completing Dec 2021 Graduated from [employer] program in Dec 2021   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    Social Determinants of Health   Financial Resource Strain: Low Risk  (09/10/2023)   Overall Financial Resource Strain (CARDIA)    Difficulty of Paying Living Expenses: Not hard at all  Food Insecurity: No Food Insecurity (09/10/2023)   Hunger Vital Sign     Worried About Running Out of Food in the Last Year: Never true    Ran Out of Food in the Last Year: Never true  Transportation Needs: No Transportation Needs (09/10/2023)   PRAPARE - Administrator, Civil Service (Medical): No    Lack of Transportation (Non-Medical): No  Physical Activity: Sufficiently Active (09/10/2023)   Exercise Vital Sign    Days of Exercise per Week: 3 days    Minutes of Exercise per Session: 60 min  Stress: No Stress Concern Present (09/10/2023)   [employer] of Occupational Health - Occupational Stress Questionnaire    Feeling of Stress : Not at all  Social Connections: Moderately Isolated (09/10/2023)   Social Connection and Isolation Panel [NHANES]    Frequency of Communication with Friends and Family: More than three times a week    Frequency of Social Gatherings with Friends and Family: More than three times a week    Attends Religious Services: 1 to 4 times per year    Active Member of [employer] or Organizations: No    Attends Banker Meetings: Never    Marital Status: Never married  Intimate Partner Violence: Not At Risk (09/10/2023)   Humiliation, Afraid, Rape, and Kick questionnaire    Fear of Current or Ex-Partner: No    Emotionally Abused: No    Physically Abused: No    Sexually Abused: No     Current Outpatient Medications:    levonorgestrel-ethinyl estradiol (SEASONALE) 0.15-0.03 MG tablet, Take 1 tablet by mouth daily., Disp: 91 tablet, Rfl: 3  No Known Allergies   ROS  Constitutional: Negative for fever or weight change.  Respiratory: Negative for cough and shortness of breath.   Cardiovascular: Negative for chest pain or palpitations.  Gastrointestinal: Negative for abdominal pain, no bowel changes.  Musculoskeletal: Negative for gait problem or joint swelling.  Skin: Negative for rash.  Neurological: Negative for dizziness or headache.  No other specific complaints in a complete review of systems (except as  listed in HPI above).   Objective  Vitals:   09/10/23 1406  BP: 126/70  Pulse: 91  Resp: 16  SpO2: 98%  Weight: 135 lb (61.2 kg)  Height: 5\' 5"  (1.651 m)    Body mass index is 22.47 kg/m.  Physical Exam  Constitutional: Patient appears well-developed and well-nourished. No distress.  HENT: Head: Normocephalic and atraumatic. Ears: B TMs ok, no erythema or effusion; Nose: Nose normal. Mouth/Throat: Oropharynx is clear and moist. No oropharyngeal exudate.  Eyes: Conjunctivae and EOM are normal. Pupils are equal, round, and reactive to light. No scleral icterus.  Neck: Normal range of motion. Neck supple. No JVD present. No thyromegaly present.  Cardiovascular: Normal rate, regular rhythm and normal heart sounds.  No murmur heard. No BLE edema. Pulmonary/Chest: Effort normal and breath sounds normal. No respiratory distress. Abdominal: Soft. Bowel sounds are normal, no distension. There is no tenderness. no masses Breast: no lumps or masses, no nipple discharge or rashes FEMALE GENITALIA:  External genitalia normal External urethra normal Vaginal vault normal without discharge or lesions Cervix normal without discharge or lesions Bimanual exam normal without masses RECTAL: not done  Musculoskeletal: Normal range of motion, no joint effusions. No gross deformities Neurological: he is alert and oriented to person, place, and time. No cranial nerve deficit. Coordination, balance, strength, speech and gait are normal.  Skin: Skin is warm and dry. No rash noted. No erythema.  Psychiatric: Patient has a normal mood and affect. behavior is normal. Judgment and thought content normal.   Fall Risk:    09/10/2023    2:06 PM 09/04/2022    3:28 PM 09/03/2021  9:55 AM 09/01/2020   10:11 AM 04/11/2020    3:01 PM  Fall Risk   Falls in the past year? 0 0 0 0 0  Number falls in past yr: 0  0 0 0  Injury with Fall? 0  0 0 0  Risk for fall due to : No Fall Risks No Fall Risks No Fall Risks     Follow up Falls prevention discussed Falls prevention discussed;Education provided Falls prevention discussed       Functional Status Survey: Is the patient deaf or have difficulty hearing?: No Does the patient have difficulty seeing, even when wearing glasses/contacts?: No Does the patient have difficulty concentrating, remembering, or making decisions?: No Does the patient have difficulty walking or climbing stairs?: No Does the patient have difficulty dressing or bathing?: No Does the patient have difficulty doing errands alone such as visiting a doctor's office or shopping?: No   Assessment & Plan  1. Well adult exam  - Cytology - PAP - Lipid panel - CBC with Differential/Platelet - COMPLETE METABOLIC PANEL WITH GFR - Hemoglobin A1c - HIV Antibody (routine testing w rflx) - RPR  2. Cervical cancer screening  - Cytology - PAP  3. Encounter for surveillance of contraceptive pills  Refill sent to pharmacy   4. Lipid screening  - Lipid panel  5. Diabetes mellitus screening  - Hemoglobin A1c  6. Long-term use of high-risk medication  - CBC with Differential/Platelet - COMPLETE METABOLIC PANEL WITH GFR  7. Screening for deficiency anemia  - CBC with Differential/Platelet  8. Screening examination for STI  - Cytology - PAP - HIV Antibody (routine testing w rflx) - RPR  9. Needs flu shot  - Flu vaccine trivalent PF, 6mos and older(Flulaval,Afluria,Fluarix,Fluzone)   -USPSTF grade A and B recommendations reviewed with patient; age-appropriate recommendations, preventive care, screening tests, etc discussed and encouraged; healthy living encouraged; see AVS for patient education given to patient -Discussed importance of 150 minutes of physical activity weekly, eat two servings of fish weekly, eat one serving of tree nuts ( cashews, pistachios, pecans, almonds.Marland Kitchen) every other day, eat 6 servings of fruit/vegetables daily and drink plenty of water and avoid  sweet beverages.   -Reviewed Health Maintenance: Yes.

## 2023-09-10 ENCOUNTER — Encounter: Payer: Self-pay | Admitting: Family Medicine

## 2023-09-10 ENCOUNTER — Other Ambulatory Visit (HOSPITAL_COMMUNITY)
Admission: RE | Admit: 2023-09-10 | Discharge: 2023-09-10 | Disposition: A | Discharge status: Home / Self Care | Payer: BC Managed Care – PPO | Source: Ambulatory Visit | Attending: Family Medicine | Admitting: Family Medicine

## 2023-09-10 ENCOUNTER — Ambulatory Visit (INDEPENDENT_AMBULATORY_CARE_PROVIDER_SITE_OTHER): Payer: BC Managed Care – PPO | Admitting: Family Medicine

## 2023-09-10 VITALS — BP 126/70 | HR 91 | Resp 16 | Ht 65.0 in | Wt 135.0 lb

## 2023-09-10 DIAGNOSIS — Z124 Encounter for screening for malignant neoplasm of cervix: Secondary | ICD-10-CM | POA: Insufficient documentation

## 2023-09-10 DIAGNOSIS — Z131 Encounter for screening for diabetes mellitus: Secondary | ICD-10-CM | POA: Diagnosis not present

## 2023-09-10 DIAGNOSIS — Z23 Encounter for immunization: Secondary | ICD-10-CM

## 2023-09-10 DIAGNOSIS — Z113 Encounter for screening for infections with a predominantly sexual mode of transmission: Secondary | ICD-10-CM | POA: Insufficient documentation

## 2023-09-10 DIAGNOSIS — Z79899 Other long term (current) drug therapy: Secondary | ICD-10-CM

## 2023-09-10 DIAGNOSIS — Z0000 Encounter for general adult medical examination without abnormal findings: Secondary | ICD-10-CM

## 2023-09-10 DIAGNOSIS — Z3041 Encounter for surveillance of contraceptive pills: Secondary | ICD-10-CM | POA: Diagnosis not present

## 2023-09-10 DIAGNOSIS — Z13220 Encounter for screening for lipoid disorders: Secondary | ICD-10-CM | POA: Diagnosis not present

## 2023-09-10 DIAGNOSIS — Z130 Encounter for screening for diseases of the blood and blood-forming organs and certain disorders involving the immune mechanism: Secondary | ICD-10-CM

## 2023-09-10 MED ORDER — LEVONORGEST-ETH ESTRAD 91-DAY 0.15-0.03 MG PO TABS
1.0000 | ORAL_TABLET | Freq: Every day | ORAL | 3 refills | Status: DC
Start: 2023-09-10 — End: 2024-10-27

## 2023-09-12 LAB — HEMOGLOBIN A1C
Hgb A1c MFr Bld: 5.3 % of total Hgb (ref <5.7)
Mean Plasma Glucose: 105 mg/dL
eAG (mmol/L): 5.8 mmol/L

## 2023-09-12 LAB — COMPLETE METABOLIC PANEL WITH GFR
AG Ratio: 1.5 (calc) (ref 1.0–2.5)
ALT: 18 U/L (ref 6–29)
AST: 16 U/L (ref 10–30)
Albumin: 5 g/dL (ref 3.6–5.1)
Alkaline phosphatase (APISO): 34 U/L (ref 31–125)
BUN: 8 mg/dL (ref 7–25)
CO2: 22 mmol/L (ref 20–32)
Calcium: 10.2 mg/dL (ref 8.6–10.2)
Chloride: 100 mmol/L (ref 98–110)
Creat: 0.93 mg/dL (ref 0.50–0.96)
Globulin: 3.4 g/dL (calc) (ref 1.9–3.7)
Glucose, Bld: 80 mg/dL (ref 65–99)
Potassium: 3.5 mmol/L (ref 3.5–5.3)
Sodium: 136 mmol/L (ref 135–146)
Total Bilirubin: 1.4 mg/dL — ABNORMAL HIGH (ref 0.2–1.2)
Total Protein: 8.4 g/dL — ABNORMAL HIGH (ref 6.1–8.1)
eGFR: 86 mL/min/{1.73_m2} (ref >=60)

## 2023-09-12 LAB — CBC WITH DIFFERENTIAL/PLATELET
Absolute Monocytes: 268 cells/uL (ref 200–950)
Basophils Absolute: 29 cells/uL (ref 0–200)
Basophils Relative: 0.5 %
Eosinophils Absolute: 120 cells/uL (ref 15–500)
Eosinophils Relative: 2.1 %
HCT: 43.3 % (ref 35.0–45.0)
Hemoglobin: 14.4 g/dL (ref 11.7–15.5)
Lymphs Abs: 2844 cells/uL (ref 850–3900)
MCH: 30.6 pg (ref 27.0–33.0)
MCHC: 33.3 g/dL (ref 32.0–36.0)
MCV: 92.1 fL (ref 80.0–100.0)
MPV: 10.9 fL (ref 7.5–12.5)
Monocytes Relative: 4.7 %
Neutro Abs: 2440 cells/uL (ref 1500–7800)
Neutrophils Relative %: 42.8 %
Platelets: 323 10*3/uL (ref 140–400)
RBC: 4.7 10*6/uL (ref 3.80–5.10)
RDW: 12.4 % (ref 11.0–15.0)
Total Lymphocyte: 49.9 %
WBC: 5.7 10*3/uL (ref 3.8–10.8)

## 2023-09-12 LAB — LIPID PANEL
Cholesterol: 212 mg/dL — ABNORMAL HIGH (ref <200)
HDL: 77 mg/dL (ref >=50)
LDL Cholesterol (Calc): 119 mg/dL (calc) — ABNORMAL HIGH
Non-HDL Cholesterol (Calc): 135 mg/dL (calc) — ABNORMAL HIGH (ref <130)
Total CHOL/HDL Ratio: 2.8 (calc) (ref <5.0)
Triglycerides: 70 mg/dL (ref <150)

## 2023-09-12 LAB — HIV ANTIBODY (ROUTINE TESTING W REFLEX): HIV 1&2 Ab, 4th Generation: NONREACTIVE

## 2023-09-12 LAB — RPR: RPR Ser Ql: NONREACTIVE

## 2023-09-16 ENCOUNTER — Encounter: Payer: Self-pay | Admitting: Family Medicine

## 2023-09-16 LAB — CYTOLOGY - PAP
Chlamydia: NEGATIVE
Comment: NEGATIVE
Comment: NEGATIVE
Comment: NORMAL
Diagnosis: NEGATIVE
Diagnosis: REACTIVE
High risk HPV: POSITIVE — AB
Neisseria Gonorrhea: NEGATIVE

## 2023-09-17 ENCOUNTER — Telehealth: Payer: Self-pay

## 2023-09-17 NOTE — Telephone Encounter (Signed)
Called patient and relayed advice per Raynelle Fanning.

## 2024-06-21 ENCOUNTER — Telehealth: Payer: Self-pay | Admitting: Family Medicine

## 2024-06-21 NOTE — Telephone Encounter (Signed)
 Pt dropped off provider acknowledgment form to be filled out by Dr Glenard. It has been placed in Dr Glenard green folder. Please contact when complete.   [phone]  Pt is aware that Dr Glenard is out of the office for the week

## 2024-06-22 NOTE — Telephone Encounter (Signed)
 Form in PCP folder to sign when she comes back from vacation.

## 2024-09-15 ENCOUNTER — Encounter: Admitting: Family Medicine

## 2024-10-27 ENCOUNTER — Encounter: Payer: Self-pay | Admitting: Family Medicine

## 2024-10-27 ENCOUNTER — Ambulatory Visit (INDEPENDENT_AMBULATORY_CARE_PROVIDER_SITE_OTHER): Payer: PRIVATE HEALTH INSURANCE | Admitting: Family Medicine

## 2024-10-27 ENCOUNTER — Other Ambulatory Visit (HOSPITAL_COMMUNITY)
Admission: RE | Admit: 2024-10-27 | Discharge: 2024-10-27 | Disposition: A | Discharge status: Home / Self Care | Payer: Self-pay | Source: Ambulatory Visit | Attending: Family Medicine | Admitting: Family Medicine

## 2024-10-27 VITALS — BP 120/72 | HR 82 | Resp 16 | Ht 64.13 in | Wt 131.1 lb

## 2024-10-27 DIAGNOSIS — Z124 Encounter for screening for malignant neoplasm of cervix: Secondary | ICD-10-CM

## 2024-10-27 DIAGNOSIS — Z79899 Other long term (current) drug therapy: Secondary | ICD-10-CM

## 2024-10-27 DIAGNOSIS — Z3041 Encounter for surveillance of contraceptive pills: Secondary | ICD-10-CM

## 2024-10-27 DIAGNOSIS — Z113 Encounter for screening for infections with a predominantly sexual mode of transmission: Secondary | ICD-10-CM | POA: Insufficient documentation

## 2024-10-27 DIAGNOSIS — Z0000 Encounter for general adult medical examination without abnormal findings: Secondary | ICD-10-CM | POA: Diagnosis not present

## 2024-10-27 DIAGNOSIS — Z130 Encounter for screening for diseases of the blood and blood-forming organs and certain disorders involving the immune mechanism: Secondary | ICD-10-CM

## 2024-10-27 DIAGNOSIS — Z13220 Encounter for screening for lipoid disorders: Secondary | ICD-10-CM

## 2024-10-27 DIAGNOSIS — Z131 Encounter for screening for diabetes mellitus: Secondary | ICD-10-CM

## 2024-10-27 MED ORDER — LEVONORGEST-ETH ESTRAD 91-DAY 0.15-0.03 MG PO TABS: 1.0000 | ORAL_TABLET | Freq: Every day | ORAL | 3 refills | Status: AC

## 2024-10-27 NOTE — Progress Notes (Signed)
 Name: Alison Nunez   MRN: [id number]    DOB: 06-24-1996   Date:10/27/2024       Progress Note  Subjective  Chief Complaint  Chief Complaint  Patient presents with   Annual Exam    HPI  Patient presents for annual CPE.  Diet: not very mindful  Exercise: going to the gym three times a week  Last Eye Exam: completed Last Dental Exam: completed  Flowsheet Row Office Visit from 10/27/2024 in [facility]  AUDIT-C Score 2    Depression: Phq 9 is  negative    10/27/2024    1:22 PM 09/10/2023    2:06 PM 09/04/2022    3:34 PM 09/03/2021    9:56 AM 09/01/2020   10:11 AM  Depression screen PHQ 2/9  Decreased Interest 0 0 0 0 0  Down, Depressed, Hopeless 0 0 0 0 0  PHQ - 2 Score 0 0 0 0 0  Altered sleeping  0 0    Tired, decreased energy  0 0    Change in appetite  0 0    Feeling bad or failure about yourself   0 0    Trouble concentrating  0 0    Moving slowly or fidgety/restless  0 0    Suicidal thoughts  0 0    PHQ-9 Score  0 0     Hypertension: BP Readings from Last 3 Encounters:  10/27/24 120/72  09/10/23 126/70  09/23/22 123/86   Obesity: Wt Readings from Last 3 Encounters:  10/27/24 131 lb 1.6 oz (59.5 kg)  09/10/23 135 lb (61.2 kg)  09/23/22 129 lb 4.8 oz (58.7 kg)   BMI Readings from Last 3 Encounters:  10/27/24 22.42 kg/m  09/10/23 22.47 kg/m  09/23/22 21.35 kg/m     Vaccines: reviewed with the patient.   Hep C Screening: completed STD testing and prevention (HIV/chl/gon/syphilis): today  Intimate partner violence: negative screen  Sexual History : not currently sexually active , broke up with long term boyfriend about 6 months ago  Menstrual History/LMP/Abnormal Bleeding: cycles every 3 months since on ocp Discussed importance of follow up if any post-menopausal bleeding: not applicable  Incontinence Symptoms: negative for symptoms   Breast cancer:  - Last Mammogram: N/A - BRCA gene screening: N/A  Osteoporosis  Prevention : Discussed high calcium and vitamin D supplementation, weight bearing exercises Bone density :no   Cervical cancer screening: performing today  Skin cancer: Discussed monitoring for atypical lesions  Colorectal cancer: N/A   Lung cancer:  Low Dose CT Chest recommended if Age 103-80 years, 20 pack-year currently smoking OR have quit w/in 15years. Patient does not qualify for screen   ECG: N/A  Advanced Care Planning: A voluntary discussion about advance care planning including the explanation and discussion of advance directives.  Discussed health care proxy and Living will, and the patient was able to identify a health care proxy as mother .  Patient does not have a living will and power of attorney of health care   Patient Active Problem List   Diagnosis Date Noted   Excessive and frequent menstruation with irregular cycle 12/13/2016   H/O iron deficiency anemia 12/13/2016   Primary dysmenorrhea 12/13/2016   Cervical polyp 12/13/2016   Encounter for surveillance of contraceptive pills 11/10/2015   Scoliosis 11/10/2015    Past Surgical History:  Procedure Laterality Date   CLOSED REDUCTION NASAL FRACTURE N/A 04/09/2016   Procedure: CLOSED REDUCTION NASAL FRACTURE;  Surgeon: Deward Dolly,  MD;  Location: [facility];  Service: ENT;  Laterality: N/A;   history of removal of cyst     pilonidal cyst x's 2.   WISDOM TOOTH EXTRACTION      Family History  Problem Relation Age of Onset   Diabetes Maternal Grandmother    Arthritis Maternal Grandmother    Heart disease Paternal Grandmother     Social History   Socioeconomic History   Marital status: Single    Spouse name: Not on file   Number of children: 0   Years of education: Not on file   Highest education level: Master's degree (e.g., MA, MS, MEng, MEd, MSW, MBA)  Occupational History   Occupation: rehab techinician     Comment: [employer]  Tobacco Use   Smoking status: Never   Smokeless  tobacco: Never  Vaping Use   Vaping status: Never Used  Substance and Sexual Activity   Alcohol use: Yes    Alcohol/week: 1.0 standard drink of alcohol    Types: 1 Glasses of wine per week    Comment: occasionally    Drug use: No   Sexual activity: Yes    Partners: Male    Birth control/protection: Pill, Condom  Other Topics Concern   Not on file  Social History Narrative   Graduated  from [employer] May 2018   Exercise Science degree,with OT master degree completing Dec 2021 Graduated from [place] program in Dec 2021   And 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    Social Drivers of Health   Financial Resource Strain: Low Risk  (10/25/2024)   Overall Financial Resource Strain (CARDIA)    Difficulty of Paying Living Expenses: Not hard at all  Food Insecurity: No Food Insecurity (10/25/2024)   Hunger Vital Sign    Worried About Running Out of Food in the Last Year: Never true    Ran Out of Food in the Last Year: Never true  Transportation Needs: No Transportation Needs (10/25/2024)   PRAPARE - Administrator, Civil Service (Medical): No    Lack of Transportation (Non-Medical): No  Physical Activity: Insufficiently Active (10/25/2024)   Exercise Vital Sign    Days of Exercise per Week: 2 days    Minutes of Exercise per Session: 60 min  Stress: No Stress Concern Present (10/25/2024)   [employer] of Occupational Health - Occupational Stress Questionnaire    Feeling of Stress: Not at all  Social Connections: Moderately Isolated (10/25/2024)   Social Connection and Isolation Panel    Frequency of Communication with Friends and Family: More than three times a week    Frequency of Social Gatherings with Friends and Family: Three times a week    Attends Religious Services: More than 4 times per year    Active Member of Clubs or Organizations: No    Attends Banker Meetings: Not on file    Marital Status:  Never married  Intimate Partner Violence: Not At Risk (10/27/2024)   Humiliation, Afraid, Rape, and Kick questionnaire    Fear of Current or Ex-Partner: No    Emotionally Abused: No    Physically Abused: No    Sexually Abused: No     Current Outpatient Medications:    levonorgestrel-ethinyl estradiol (SEASONALE) 0.15-0.03 MG tablet, Take 1 tablet by mouth daily., Disp: 91 tablet, Rfl: 3  No Known Allergies   ROS  Constitutional: Negative for fever or weight change.  Respiratory: Negative for  cough and shortness of breath.   Cardiovascular: Negative for chest pain or palpitations.  Gastrointestinal: Negative for abdominal pain, no bowel changes.  Musculoskeletal: Negative for gait problem or joint swelling.  Skin: Negative for rash.  Neurological: Negative for dizziness or headache.  No other specific complaints in a complete review of systems (except as listed in HPI above).   Objective  Vitals:   10/27/24 1327  BP: 120/72  Pulse: 82  Resp: 16  SpO2: 99%  Weight: 131 lb 1.6 oz (59.5 kg)  Height: 5' 4.13 (1.629 m)    Body mass index is 22.42 kg/m.  Physical Exam  Constitutional: Patient appears well-developed and well-nourished. No distress.  HENT: Head: Normocephalic and atraumatic. Ears: B TMs ok, no erythema or effusion; Nose: Nose normal. Mouth/Throat: Oropharynx is clear and moist. No oropharyngeal exudate.  Eyes: Conjunctivae and EOM are normal. Pupils are equal, round, and reactive to light. No scleral icterus.  Neck: Normal range of motion. Neck supple. No JVD present. No thyromegaly present.  Cardiovascular: Normal rate, regular rhythm and normal heart sounds.  No murmur heard. No BLE edema. Pulmonary/Chest: Effort normal and breath sounds normal. No respiratory distress. Abdominal: Soft. Bowel sounds are normal, no distension. There is no tenderness. no masses Breast: no lumps or masses, no nipple discharge or rashes FEMALE GENITALIA:  External genitalia  normal External urethra normal Vaginal vault normal without discharge or lesions Cervix normal without discharge or lesions Bimanual exam normal without masses RECTAL:not done  Musculoskeletal: Normal range of motion, no joint effusions. No gross deformities Neurological: he is alert and oriented to person, place, and time. No cranial nerve deficit. Coordination, balance, strength, speech and gait are normal.  Skin: Skin is warm and dry. No rash noted. No erythema.  Psychiatric: Patient has a normal mood and affect. behavior is normal. Judgment and thought content normal.      Assessment & Plan Adult Wellness Visit Routine wellness visit with stable health status. Engages in regular exercise and balanced diet. No sexual activity or significant family cancer history. Normal weight, BMI, and blood pressure. - Continue regular physical activity, including cardio and strength training. - Maintain balanced diet with adequate calcium and vitamin D. - Ensure vaccinations are up to date, including flu and HPV. - Encouraged social connections and regular communication with family and friends.  Screening for lipoid disorders Lipid panel slightly elevated last year but below 130 mg/dL. No immediate concern, recommended monitoring. - Ordered lipid panel through [facility] if covered by insurance. - If not covered, consider repeating every 3-4 years.  Screening for diabetes mellitus Routine diabetes screening with A1c test. - Ordered A1c test through [facility].  Screening for malignant neoplasm of cervix Routine cervical cancer screening with Pap smear. - Performed Pap smear today. - Sent Pap smear for analysis.  Screening for infections with a predominantly sexual mode of transmission Screening for sexually transmitted infections including HIV and syphilis. - Ordered HIV and syphilis tests through [facility].  Screening for diseases of the blood and blood-forming organs and certain disorders  involving the immune mechanism Routine anemia screening with CBC. - Ordered CBC through [facility].  Surveillance of contraceptive pills Continued use of Seasonal birth control for contraception and cycle regulation. - Continue Seasonal birth control prescription. - Sent prescription to [facility] pharmacy.       -USPSTF grade A and B recommendations reviewed with patient; age-appropriate recommendations, preventive care, screening tests, etc discussed and encouraged; healthy living encouraged; see AVS for patient education given  to patient -Discussed importance of 150 minutes of physical activity weekly, eat two servings of fish weekly, eat one serving of tree nuts ( cashews, pistachios, pecans, almonds.SABRA) every other day, eat 6 servings of fruit/vegetables daily and drink plenty of water and avoid sweet beverages.   -Reviewed Health Maintenance: Yes.

## 2024-10-27 NOTE — Patient Instructions (Signed)

## 2024-11-01 ENCOUNTER — Ambulatory Visit: Payer: Self-pay | Admitting: Family Medicine

## 2024-11-01 LAB — CYTOLOGY - PAP
Chlamydia: POSITIVE — AB
Comment: NEGATIVE
Comment: NEGATIVE
Comment: NEGATIVE
Comment: NORMAL
Diagnosis: UNDETERMINED — AB
High risk HPV: POSITIVE — AB
Neisseria Gonorrhea: NEGATIVE
Trichomonas: NEGATIVE

## 2024-11-02 ENCOUNTER — Encounter: Payer: Self-pay | Admitting: Family Medicine

## 2024-11-02 ENCOUNTER — Ambulatory Visit (INDEPENDENT_AMBULATORY_CARE_PROVIDER_SITE_OTHER): Payer: PRIVATE HEALTH INSURANCE | Admitting: Family Medicine

## 2024-11-02 VITALS — BP 122/72 | HR 83 | Resp 16 | Ht 64.13 in | Wt 131.1 lb

## 2024-11-02 DIAGNOSIS — A749 Chlamydial infection, unspecified: Secondary | ICD-10-CM

## 2024-11-02 MED ORDER — AZITHROMYCIN 500 MG PO TABS
1000.0000 mg | ORAL_TABLET | Freq: Once | ORAL | Status: AC
Start: 2024-11-02 — End: 2024-11-02
  Administered 2024-11-02: 1000 mg via ORAL

## 2024-11-02 NOTE — Progress Notes (Signed)
 Name: Alison Nunez   MRN: [id number]    DOB: 1996/01/07   Date:11/02/2024       Progress Note  Subjective  Chief Complaint  Chief Complaint  Patient presents with   Results    Discussed the use of AI scribe software for clinical note transcription with the patient, who gave verbal consent to proceed.  History of Present Illness Alison Nunez is a 28 year old female who presents for follow-up after an abnormal Pap smear and positive chlamydia test.  She recently had a Pap smear which showed atypical squamous cells of undetermined significance (ASC-US ).  She tested positive for chlamydia and has been treated with azithromycin 1 gram. She had a boyfriend for five years, but she has since broken up. No symptoms such as vaginal discharge, pain, or discomfort are reported.  No vaginal discharge, pain, or discomfort.    Patient Active Problem List   Diagnosis Date Noted   Excessive and frequent menstruation with irregular cycle 12/13/2016   H/O iron deficiency anemia 12/13/2016   Primary dysmenorrhea 12/13/2016   Cervical polyp 12/13/2016   Encounter for surveillance of contraceptive pills 11/10/2015   Scoliosis 11/10/2015    Social History   Tobacco Use   Smoking status: Never   Smokeless tobacco: Never  Substance Use Topics   Alcohol use: Yes    Alcohol/week: 1.0 standard drink of alcohol    Types: 1 Glasses of wine per week    Comment: occasionally      Current Outpatient Medications:    levonorgestrel-ethinyl estradiol (SEASONALE) 0.15-0.03 MG tablet, Take 1 tablet by mouth daily., Disp: 91 tablet, Rfl: 3  No Known Allergies  ROS  Ten systems reviewed and is negative except as mentioned in HPI    Objective  Vitals:   11/02/24 0755  BP: 122/72  Pulse: 83  Resp: 16  SpO2: 98%  Weight: 131 lb 1.6 oz (59.5 kg)  Height: 5' 4.13 (1.629 m)    Body mass index is 22.41 kg/m.   Physical Exam CONSTITUTIONAL: Patient appears well-developed and well-nourished.  No distress. HEENT: Head atraumatic, normocephalic, neck supple. CARDIOVASCULAR: Normal rate, regular rhythm and normal heart sounds. No murmur heard. No BLE edema. PULMONARY: Effort normal and breath sounds normal. Lungs clear to auscultation. No respiratory distress. ABDOMINAL: There is no tenderness or distention. MUSCULOSKELETAL: Normal gait. Without gross motor or sensory deficit. PSYCHIATRIC: Patient has a normal mood and affect. Behavior is normal. Judgment and thought content normal.  Recent Results (from the past 2160 hours)  Cytology - PAP     Status: Abnormal   Collection Time: 10/27/24  1:53 PM  Result Value Ref Range   High risk HPV Positive (A)    Neisseria Gonorrhea Negative    Chlamydia Positive (A)    Trichomonas Negative    Adequacy      Satisfactory but limited for evaluation with partially obscuring blood;   Adequacy transformation zone component present.    Diagnosis (A)     - Atypical squamous cells of undetermined significance (ASC-US )   Comment Normal Reference Range Trichomonas - Negative    Comment Normal Reference Ranger Chlamydia - Negative    Comment      Normal Reference Range Neisseria Gonorrhea - Negative   Comment Normal Reference Range HPV - Negative       Assessment & Plan Chlamydial infection Positive chlamydia test, asymptomatic. Partner informed. Treated with azithromycin. Discussed re-exposure risk and condom use. - Advised partner notification to health department for  contact tracing. - Instructed to use condoms if sexually active until retesting. - Scheduled follow-up in two months for test of cure or re-exposure. - Instructed to report if vomiting occurs within two hours of medication.  Atypical squamous cells of undetermined significance (ASC-US ) on Pap smear ASC-US  likely due to HPV, not cancerous. Many under 30 clear virus spontaneously. Recommended repeat Pap smear in one year unless symptoms develop. - Will repeat Pap smear in one  year unless symptoms develop.

## 2024-12-30 LAB — BASIC METABOLIC PANEL WITH GFR
BUN: 12 (ref 4–21)
CO2: 21 (ref 13–22)
Chloride: 99 (ref 99–108)
Creatinine: 1 (ref 0.5–1.1)
Glucose: 75
Potassium: 4.7 meq/L (ref 3.5–5.1)
Sodium: 136 — AB (ref 137–147)

## 2024-12-30 LAB — HEPATIC FUNCTION PANEL
ALT: 20 U/L (ref 7–35)
AST: 20 (ref 13–35)
Alkaline Phosphatase: 40 (ref 25–125)
Bilirubin, Total: 1.3

## 2024-12-30 LAB — CBC AND DIFFERENTIAL
HCT: 42 (ref 36–46)
Hemoglobin: 13.4 (ref 12.0–16.0)
Platelets: 29 K/uL — AB (ref 150–400)
WBC: 6.2

## 2024-12-30 LAB — LIPID PANEL
Cholesterol: 176 (ref 0–200)
HDL: 76 — AB (ref 35–70)
LDL Cholesterol: 88
Triglycerides: 60 (ref 40–160)

## 2024-12-30 LAB — HEMOGLOBIN A1C: Hemoglobin A1C: 5.4

## 2024-12-30 LAB — CBC: RBC: 4.53 (ref 3.87–5.11)

## 2024-12-30 LAB — COMPREHENSIVE METABOLIC PANEL WITH GFR: Calcium: 9.8 (ref 8.7–10.7)

## 2024-12-31 LAB — LAB REPORT - SCANNED
A1c: 5.4
EGFR: 75
HM HIV Screening: NEGATIVE

## 2025-01-03 ENCOUNTER — Encounter: Payer: Self-pay | Admitting: Family Medicine

## 2025-01-03 ENCOUNTER — Other Ambulatory Visit (HOSPITAL_COMMUNITY)
Admission: RE | Admit: 2025-01-03 | Discharge: 2025-01-03 | Disposition: A | Discharge status: Home / Self Care | Payer: PRIVATE HEALTH INSURANCE | Source: Ambulatory Visit | Attending: Family Medicine | Admitting: Family Medicine

## 2025-01-03 ENCOUNTER — Ambulatory Visit: Payer: Self-pay | Admitting: Family Medicine

## 2025-01-03 VITALS — BP 116/68 | HR 69 | Resp 16 | Ht 64.13 in | Wt 134.1 lb

## 2025-01-03 DIAGNOSIS — Z8619 Personal history of other infectious and parasitic diseases: Secondary | ICD-10-CM | POA: Diagnosis present

## 2025-01-03 DIAGNOSIS — R87610 Atypical squamous cells of undetermined significance on cytologic smear of cervix (ASC-US): Secondary | ICD-10-CM | POA: Insufficient documentation

## 2025-01-03 NOTE — Progress Notes (Signed)
 Name: Alison Nunez   MRN: [id number]    DOB: August 30, 1996   Date:01/03/2025       Progress Note  Subjective  Chief Complaint  Chief Complaint  Patient presents with   Medical Management of Chronic Issues   Discussed the use of AI scribe software for clinical note transcription with the patient, who gave verbal consent to proceed.  History of Present Illness Alison Nunez is a 29 year old female who presents for a follow-up after a Pap smear showing ASCUS, positive HPV, and chlamydia.  She returns for a follow-up after a Pap smear in October revealed ASCUS, positive HPV, and chlamydia. She was treated at that time and has since ended her relationship with her boyfriend. No sexual activity since her last menstrual cycle.  She denies experiencing any cramping, fever, chills, vaginal discharge, or pelvic pain over the past week.  She underwent lab work through her workplace last week, which included tests for HIV and syphilis.    Patient Active Problem List   Diagnosis Date Noted   Excessive and frequent menstruation with irregular cycle 12/13/2016   H/O iron deficiency anemia 12/13/2016   Primary dysmenorrhea 12/13/2016   Cervical polyp 12/13/2016   Encounter for surveillance of contraceptive pills 11/10/2015   Scoliosis 11/10/2015    Past Surgical History:  Procedure Laterality Date   CLOSED REDUCTION NASAL FRACTURE N/A 04/09/2016   Procedure: CLOSED REDUCTION NASAL FRACTURE;  Surgeon: Deward Dolly, MD;  Location: [facility];  Service: ENT;  Laterality: N/A;   history of removal of cyst     pilonidal cyst x's 2.   WISDOM TOOTH EXTRACTION      Family History  Problem Relation Age of Onset   Diabetes Maternal Grandmother    Arthritis Maternal Grandmother    Heart disease Paternal Grandmother     Social History   Tobacco Use   Smoking status: Never   Smokeless tobacco: Never  Substance Use Topics   Alcohol use: Yes    Alcohol/week: 1.0 standard drink of alcohol     Types: 1 Glasses of wine per week    Comment: occasionally     Current Medications[1]  Allergies[2]  I personally reviewed active problem list, medication list, allergies, family history with the patient/caregiver today.   ROS  Ten systems reviewed and is negative except as mentioned in HPI    Objective Physical Exam CONSTITUTIONAL: Patient appears well-developed and well-nourished. No distress. Normal exam findings. HEENT: Head atraumatic, normocephalic, neck supple. CARDIOVASCULAR: Normal rate, regular rhythm and normal heart sounds. No murmur heard. No BLE edema. PULMONARY: Effort normal and breath sounds normal. No respiratory distress. ABDOMINAL: There is no tenderness or distention. MUSCULOSKELETAL: Normal gait. Without gross motor or sensory deficit. PSYCHIATRIC: Patient has a normal mood and affect. Behavior is normal. Judgment and thought content normal.  Vitals:   01/03/25 0805  BP: 116/68  Pulse: 69  Resp: 16  SpO2: 99%  Weight: 134 lb 1.6 oz (60.8 kg)  Height: 5' 4.13 (1.629 m)    Body mass index is 22.92 kg/m.  Recent Results (from the past 2160 hours)  Cytology - PAP     Status: Abnormal   Collection Time: 10/27/24  1:53 PM  Result Value Ref Range   High risk HPV Positive (A)    Neisseria Gonorrhea Negative    Chlamydia Positive (A)    Trichomonas Negative    Adequacy      Satisfactory but limited for evaluation with partially obscuring blood;  Adequacy transformation zone component present.    Diagnosis (A)     - Atypical squamous cells of undetermined significance (ASC-US )   Comment Normal Reference Range Trichomonas - Negative    Comment Normal Reference Ranger Chlamydia - Negative    Comment      Normal Reference Range Neisseria Gonorrhea - Negative   Comment Normal Reference Range HPV - Negative     PHQ2/9:    01/03/2025    8:05 AM 11/02/2024    7:47 AM 10/27/2024    1:22 PM 09/10/2023    2:06 PM 09/04/2022    3:34 PM  Depression  screen PHQ 2/9  Decreased Interest 0 0 0 0 0  Down, Depressed, Hopeless 0 0 0 0 0  PHQ - 2 Score 0 0 0 0 0  Altered sleeping    0 0  Tired, decreased energy    0 0  Change in appetite    0 0  Feeling bad or failure about yourself     0 0  Trouble concentrating    0 0  Moving slowly or fidgety/restless    0 0  Suicidal thoughts    0 0  PHQ-9 Score    0  0      Data saved with a previous flowsheet row definition    phq 9 is negative  Fall Risk:    01/03/2025    8:04 AM 11/02/2024    7:47 AM 10/27/2024    1:22 PM 09/10/2023    2:06 PM 09/04/2022    3:28 PM  Fall Risk   Falls in the past year? 0 0 0 0 0  Number falls in past yr: 0 0 0 0   Injury with Fall? 0 0  0  0    Risk for fall due to : No Fall Risks No Fall Risks No Fall Risks No Fall Risks No Fall Risks  Follow up Falls evaluation completed Falls evaluation completed Falls evaluation completed Falls prevention discussed Falls prevention discussed;Education provided      Data saved with a previous flowsheet row definition      Assessment & Plan ASCUS of cervix with history of chlamydia ASCUS on Pap smear with positive HPV and chlamydia. Awaiting HIV and syphilis test results. - Await results of HIV and syphilis tests. - Schedule Pap smear with HPV, gonorrhea, and chlamydia testing in the fall.  General Health Maintenance Routine health maintenance discussed. - Schedule physical exam in the fall.        [1]  Current Outpatient Medications:    Dapsone 5 % topical gel, Apply topically every morning., Disp: , Rfl:    levonorgestrel-ethinyl estradiol (SEASONALE) 0.15-0.03 MG tablet, Take 1 tablet by mouth daily., Disp: 91 tablet, Rfl: 3   RETIN-A 0.025 % cream, Apply topically., Disp: , Rfl:  [2] No Known Allergies

## 2025-01-04 ENCOUNTER — Ambulatory Visit: Payer: Self-pay | Admitting: Family Medicine

## 2025-01-04 LAB — CERVICOVAGINAL ANCILLARY ONLY
Chlamydia: NEGATIVE
Comment: NEGATIVE
Comment: NORMAL
Neisseria Gonorrhea: NEGATIVE

## 2025-01-05 ENCOUNTER — Ambulatory Visit: Payer: Self-pay | Admitting: Family Medicine

## 2025-01-05 ENCOUNTER — Other Ambulatory Visit (HOSPITAL_COMMUNITY): Payer: Self-pay

## 2025-11-03 ENCOUNTER — Encounter: Payer: PRIVATE HEALTH INSURANCE | Admitting: Family Medicine
